# Patient Record
Sex: Male | Born: 1970
Health system: Southern US, Community
[De-identification: ages and names within clinical notes are randomized; demographics above are authoritative.]

## PROBLEM LIST (undated history)

## (undated) DIAGNOSIS — K219 Gastro-esophageal reflux disease without esophagitis: Secondary | ICD-10-CM

## (undated) DIAGNOSIS — R0789 Other chest pain: Secondary | ICD-10-CM

## (undated) DIAGNOSIS — K5190 Ulcerative colitis, unspecified, without complications: Secondary | ICD-10-CM

## (undated) DIAGNOSIS — E7800 Pure hypercholesterolemia, unspecified: Secondary | ICD-10-CM

## (undated) DIAGNOSIS — K603 Anal fistula, unspecified: Secondary | ICD-10-CM

## (undated) DIAGNOSIS — Z8719 Personal history of other diseases of the digestive system: Secondary | ICD-10-CM

## (undated) DIAGNOSIS — R51 Headache: Secondary | ICD-10-CM

## (undated) DIAGNOSIS — K611 Rectal abscess: Secondary | ICD-10-CM

## (undated) DIAGNOSIS — H9193 Unspecified hearing loss, bilateral: Secondary | ICD-10-CM

## (undated) DIAGNOSIS — U071 COVID-19: Secondary | ICD-10-CM

## (undated) DIAGNOSIS — K649 Unspecified hemorrhoids: Secondary | ICD-10-CM

## (undated) DIAGNOSIS — Z5189 Encounter for other specified aftercare: Secondary | ICD-10-CM

## (undated) DIAGNOSIS — IMO0001 Reserved for inherently not codable concepts without codable children: Secondary | ICD-10-CM

## (undated) DIAGNOSIS — R519 Headache, unspecified: Secondary | ICD-10-CM

## (undated) DIAGNOSIS — D649 Anemia, unspecified: Secondary | ICD-10-CM

## (undated) DIAGNOSIS — G43909 Migraine, unspecified, not intractable, without status migrainosus: Secondary | ICD-10-CM

## (undated) DIAGNOSIS — Z8711 Personal history of peptic ulcer disease: Secondary | ICD-10-CM

## (undated) DIAGNOSIS — K4090 Unilateral inguinal hernia, without obstruction or gangrene, not specified as recurrent: Secondary | ICD-10-CM

## (undated) DIAGNOSIS — Z973 Presence of spectacles and contact lenses: Secondary | ICD-10-CM

## (undated) HISTORY — DX: Anal fistula, unspecified: K60.30

## (undated) HISTORY — DX: Unspecified hemorrhoids: K64.9

## (undated) HISTORY — DX: Personal history of other diseases of the digestive system: Z87.19

## (undated) HISTORY — DX: Migraine, unspecified, not intractable, without status migrainosus: G43.909

## (undated) HISTORY — DX: Pure hypercholesterolemia, unspecified: E78.00

## (undated) HISTORY — DX: Gastro-esophageal reflux disease without esophagitis: K21.9

## (undated) HISTORY — DX: Anemia, unspecified: D64.9

## (undated) HISTORY — DX: Encounter for other specified aftercare: Z51.89

## (undated) HISTORY — DX: Headache: R51

## (undated) HISTORY — DX: Personal history of peptic ulcer disease: Z87.11

## (undated) HISTORY — DX: Anal fistula: K60.3

## (undated) HISTORY — DX: Unilateral inguinal hernia, without obstruction or gangrene, not specified as recurrent: K40.90

## (undated) HISTORY — PX: HEMORRHOID SURGERY: SHX153

## (undated) HISTORY — DX: Reserved for inherently not codable concepts without codable children: IMO0001

## (undated) HISTORY — DX: Other chest pain: R07.89

## (undated) HISTORY — DX: Rectal abscess: K61.1

## (undated) HISTORY — DX: Headache, unspecified: R51.9

## (undated) HISTORY — PX: OTHER SURGICAL HISTORY: SHX169

---

## 2004-06-16 HISTORY — PX: SPINE SURGERY: SHX786

## 2004-11-22 ENCOUNTER — Inpatient Hospital Stay (HOSPITAL_COMMUNITY): Admission: EM | Admit: 2004-11-22 | Discharge: 2004-11-27 | Payer: Self-pay | Admitting: Emergency Medicine

## 2006-06-16 HISTORY — PX: SPINE SURGERY: SHX786

## 2007-11-21 ENCOUNTER — Encounter: Admission: RE | Admit: 2007-11-21 | Discharge: 2007-11-21 | Payer: Self-pay | Admitting: Orthopaedic Surgery

## 2008-04-03 ENCOUNTER — Ambulatory Visit (HOSPITAL_COMMUNITY): Admission: RE | Admit: 2008-04-03 | Discharge: 2008-04-03 | Payer: Self-pay | Admitting: General Surgery

## 2008-04-03 ENCOUNTER — Encounter (INDEPENDENT_AMBULATORY_CARE_PROVIDER_SITE_OTHER): Payer: Self-pay | Admitting: General Surgery

## 2009-06-16 HISTORY — PX: MOUTH SURGERY: SHX715

## 2010-10-29 NOTE — Op Note (Signed)
NAME:  ZAVON, HYSON NO.:  [id number]   MEDICAL RECORD NO.:  [id number]          PATIENT TYPE:  AMB   LOCATION:  DAY                          FACILITY:  [facility]   PHYSICIAN:  Sammuel Hines. Daiva Nakayama, M.D. DATE OF BIRTH:  Jul 27, 1970   DATE OF PROCEDURE:  04/03/2008  DATE OF DISCHARGE:                               OPERATIVE REPORT   PREOPERATIVE DIAGNOSIS:  Internal and external hemorrhoids.   POSTOPERATIVE DIAGNOSIS:  Internal and external hemorrhoids.   PROCEDURE:  Hemorrhoidectomy.   SURGEON:  Dr. Marlou Starks.   ANESTHESIA:  General endotracheal.   PROCEDURE IN DETAIL:  After informed consent was obtained, the patient  was brought to the operating room and placed in supine position on the  table.  After induction of general anesthesia, the patient was moved  into a lithotomy position.  His perirectal area was then prepped with  Betadine and draped in usual sterile fashion.  The patient had a large  hemorrhoid mostly in the right anterior position.  The perirectal area  was infiltrated with 4% Marcaine with epinephrine and 1 mL of Wydase and  the tissue was massaged gently for several minutes.  The perirectal area  was then draped with 00.25% Marcaine with epinephrine.  A bullet  retractor was placed in the rectum and the rectum was examined  circumferentially.  The patient had a large hemorrhoid in the right  anterior position with both an internal and external component.  The  rest of the perirectal area looked pretty good with no significant  redundant hemorrhoidal tissue.  At this point the significant hemorrhoid  was grasped with two Allis clamps and elevated the base of the appendix.  The base of the hemorrhoid was scored with a 15-blade knife and then  clamped with a hemostat.  The hemorrhoid itself was then excised on top  of the hemostat sharply with tenotomy scissors and this hemorrhoid was  sent to pathology for further evaluation.  The incision was then  closed  with a running 2-0 chromic stitch.  The last few millimeters of the  incision on the outside skin was left open.  The suture line was  reinforced in a couple areas for hemostasis reasons with another figure-  of-eight 2-0 chronic stitch.  Once this was accomplished, the incision  appeared to be nicely closed and was completely hemostatic.  The opening  at the outside was also touched with electrocautery.  At this point the  bullet retractor was removed from the rectum and the rectum looked very  normal now that the large hemorrhoid was removed.  Again there were no  areas of bulging hemorrhoid tissue.  The incision was then coated with  dibucaine ointment and a small piece of Gelfoam with dibucaine ointment  was placed inside the rectum.  Sterile dressings were then applied.  The  patient tolerated the procedure well.  At the end of the case all needle  and sponge counts correct.  The patient was awakened and taken to  recovery in stable condition.      Sammuel Hines. Marlou Starks  III, M.D.  Electronically Signed    PST/MEDQ  D:  04/03/2008  T:  04/03/2008  Job:  [id number]

## 2010-11-01 NOTE — Discharge Summary (Signed)
NAME:  Marc Mercado, LIPTAK NO.:  [id number]   MEDICAL RECORD NO.:  [id number]          PATIENT TYPE:  INP   LOCATION:  5733                         FACILITY:  [facility]   PHYSICIAN:  Raelyn Ensign. Santogade, M.D.DATE OF BIRTH:  24-Jan-1971   DATE OF ADMISSION:  11/22/2004  DATE OF DISCHARGE:  11/27/2004                                 DISCHARGE SUMMARY   DISCHARGE DIAGNOSES:  1.  Severe ulcerative colitis with rectal bleeding.  2.  Posthemorrhagic anemia.  3.  Abdominal pain.   HISTORY OF PRESENT ILLNESS:  Mr. Speciale is a 40 year old male with a few  months of chronic diarrhea and abdominal pain, escalating to 16 to 20 bowel  movements per day, cramping, and tenesmus unresponsive to antibiotics.  He  had a colonoscopy the day of admission that revealed severe ulcerative  colitis with confluent ulcers in the sigmoid.  He was pale and tachycardic  and admitted for inpatient treatment of new onset new diagnosis of  ulcerative colitis.   MEDICATIONS:  Only Imodium and Lomotil.   ALLERGIES:  LEVAQUIN.   For the remainder of the history, please see Dr. Oletta Lamas dictation on November 22, 2004.   PHYSICAL EXAMINATION:  VITAL SIGNS:  He was febrile to 100.2 with blood  pressure 104/60, and pulse 95.  HEENT:  Eyes were anicteric, conjunctivae pale.  CHEST:  Clear.  Heart sounds regular rate and rhythm.  ABDOMEN:  Soft and distended with hyperactive bowel sounds.  Nonlocalizing  generalized mild tenderness.   HOSPITAL COURSE:  The patient was treated with IV steroids and subsequently  p.o. prednisone as well as Asacol.  Gradually his symptoms abated.  His  admission hemoglobin was 7.7 and he was transfused 2 units of blood.  For  the last 3 days of admission his hemoglobin remained over 9.4 and discharge  hemoglobin was 9.8.  White blood cell count on discharge was 17.3 which was  likely secondary to steroids.  Sedimentation rate was 30.  Electrolytes on  discharge were normal with a  blood sugar of 114.  Liver function tests were  negative or normal.  Blood cultures were negative.  On discharge day, the  patient was still having approximately seven small, but formed bowel  movements per day, occasionally with some blood present.  He had no  abdominal pain and was feeling quite well.   CONDITION ON DISCHARGE:  Improved.   DISCHARGE MEDICATIONS:  1.  Prednisone 50 mg q.a.m. and 20 mg with supper.  2.  Asacol 400 mg six pills b.i.d.   FOLLOW UP:  Return to the office to see Dr. Oletta Lamas within 1 week for  instructions on steroid taper.       PJS/MEDQ  D:  11/27/2004  T:  11/27/2004  Job:  [id number]   cc:   Jeneen Rinks L. Rolla Flatten., M.D.  [address], [place]  [place]  [place] [address]  Fax: [phone]

## 2010-11-01 NOTE — H&P (Signed)
NAME:  Marc Mercado, Marc Mercado NO.:  [id number]   MEDICAL RECORD NO.:  [id number]          PATIENT TYPE:  INP   LOCATION:                               FACILITY:  [facility]   PHYSICIAN:  Marc Mercado., M.D.DATE OF BIRTH:  [birth year]   DATE OF ADMISSION:  11/22/2004  DATE OF DISCHARGE:                                HISTORY & PHYSICAL   REASON FOR ADMISSION:  Severe ulcerative colitis and rectal bleeding.   HISTORY OF PRESENT ILLNESS:  A 40 year old gentleman who has had chronic  diarrhea and abdominal pain for a couple of months.  He normally had one to  two stools a day and approximately the end of March to early April, he woke  up with profuse diarrhea, some vomiting and cramping and thought this was an  acute viral gastroenteritis.  It got worse over the subsequent couple of  days with 16-20 bowel movements a day, cramping abdominal pain and tenesmus.  He had no blood, it was watery stools.  He was given Cipro with the idea  that this was infectious.  He had stool cultures done that were normal for  Clostridium difficile, Salmonella enteric pathogens, O&P.  The patient had  not had any recent travel.  No one else in the family had been sick.  He did  have fish, cats, dogs and a lizard and had been on Flagyl with some  improvement.  At the time that I saw him, he was on Flagyl.  His stools were  firming up and he had gone from 20 bowel movements a day down to four a day.  His exam in mid May revealed a nontender abdomen with brown loose stool that  was trace heme-positive.  My feeling at that time was that this was probably  a resolving infection.  He has continued to have loose stools, called back,  was having worsening stools and we decided to set him up for a colonoscopy.  Colonoscopy performed today revealed severe ulcerative colitis with  pancolitis and confluent ulcers in the sigmoid colon.  The patient appeared  quite pale and was tachycardic and it was felt  that inpatient treatment was  warranted.   MEDICATIONS:  Only Imodium and Lomotil.   ALLERGIES:  HE IS ALLERGIC TO LEVAQUIN.   PAST MEDICAL HISTORY:  No chronic medical problems.  He did have a history  of gastric ulcers and duodenal ulcers, history of migraines and has had  previous hemorrhoid injections by Dr. Lennie Hummer.   FAMILY HISTORY:  Mother died of breast and ovarian cancer.  No family  history of colon cancer, inflammatory bowel disease.  Two children are  healthy.   SOCIAL HISTORY:  He is married, works in [employer], engineering.  Does not smoke or  drink.   REVIEW OF SYSTEMS:  Primarily remarkable for the abdominal cramps and  diarrhea.  Otherwise is negative.   PHYSICAL EXAMINATION:  VITAL SIGNS:  The patient's temperature is 100, pulse  110, blood pressure 110/68.  GENERAL:  A pale, ill-appearing white male following a colonoscopy.  He  is  waking up and is alert and oriented.  He denies any pain currently.  HEENT:  Eyes clear, nonicteric.  Throat and mucous membranes quite dry.  HEART:  A regular rate and rhythm without murmurs or gallops.  LUNGS:  Clear.  ABDOMEN:  Soft, slightly distended with hyperactive bowel sounds with mild  tenderness and nonlocalizing.   ASSESSMENT:  1.  Severe ulcerative colitis with confluent ulcers in the left colon but      pancolitis extending to the cecum.  2.  Probable anemia secondary to chronic rectal bleeding.   PLAN:  Will admit, keep him on clear liquids.  Start IV steroids and  transfusion as needed.           ______________________________  Marc Mercado., M.D.     Marc Mercado  D:  11/22/2004  T:  11/22/2004  Job:  [id number]   cc:   Marc Mercado. Marc Mercado, M.D.  [address] [place]  [place] [address]  Fax: [phone]

## 2011-03-18 LAB — HEMOGLOBIN AND HEMATOCRIT, BLOOD: HCT: 33 — ABNORMAL LOW

## 2012-02-27 ENCOUNTER — Ambulatory Visit (INDEPENDENT_AMBULATORY_CARE_PROVIDER_SITE_OTHER): Payer: BC Managed Care – PPO | Admitting: General Surgery

## 2012-02-27 ENCOUNTER — Encounter (INDEPENDENT_AMBULATORY_CARE_PROVIDER_SITE_OTHER): Payer: Self-pay | Admitting: General Surgery

## 2012-02-27 VITALS — BP 126/84 | HR 100 | Temp 97.9°F | Ht 71.0 in | Wt 202.6 lb

## 2012-02-27 DIAGNOSIS — K5190 Ulcerative colitis, unspecified, without complications: Secondary | ICD-10-CM

## 2012-02-27 MED ORDER — HYDROCORTISONE ACE-PRAMOXINE 2.5-1 % RE CREA: TOPICAL_CREAM | Freq: Four times a day (QID) | RECTAL | Status: AC

## 2012-02-27 NOTE — Patient Instructions (Signed)
Will try analpram to settle down inflammation

## 2012-03-01 ENCOUNTER — Encounter (INDEPENDENT_AMBULATORY_CARE_PROVIDER_SITE_OTHER): Payer: Self-pay | Admitting: General Surgery

## 2012-03-01 NOTE — Progress Notes (Signed)
Subjective:     Patient ID: Marc Mercado, male   DOB: [birth year], 41 y.o.   MRN: [id number]  HPI The patient is a 41 year old white male who we have seen several times in the past with internal hemorrhoid issues. The patient also has ulcerative colitis. He was doing well until about 7 months ago at which time he developed the stomach prior superior he had lots of diarrhea. This caused his ulcerative colitis flareup. Over the last several months he has been on and off steroids. The specimen that he had some issues with some great red blood with his bowel movements and some burning sensation. This is now resolved.  Review of Systems  Constitutional: Negative.   HENT: Negative.   Eyes: Negative.   Respiratory: Negative.   Cardiovascular: Negative.   Gastrointestinal: Positive for blood in stool and rectal pain.  Genitourinary: Negative.   Musculoskeletal: Negative.   Skin: Negative.   Neurological: Negative.   Hematological: Negative.   Psychiatric/Behavioral: Negative.        Objective:   Physical Exam  Constitutional: He is oriented to person, place, and time. He appears well-developed and well-nourished.  HENT:  Head: Normocephalic and atraumatic.  Eyes: Conjunctivae normal and EOM are normal. Pupils are equal, round, and reactive to light.  Neck: Normal range of motion. Neck supple.  Cardiovascular: Normal rate, regular rhythm and normal heart sounds.   Pulmonary/Chest: Effort normal and breath sounds normal.  Abdominal: Soft. Bowel sounds are normal. There is no tenderness.  Genitourinary:       On rectal exam he has very mild sort of internal and external hemorrhoidal tissue. On anoscopic exam it looks as though the mucosa of his rectum is generally a little bit inflamed.  Musculoskeletal: Normal range of motion.  Neurological: He is alert and oriented to person, place, and time.  Skin: Skin is warm and dry.  Psychiatric: He has a normal mood and affect. His behavior is  normal.       Assessment:     The patient has what looks like a flareup of his ulcerative colitis. I do not appreciate any hemorrhoidal tissue that enough to intervene surgically.    Plan:     At this point we will continue his ulcerative colitis treatment. We will also add some topical steroid to his lower rectal area to see if this helps. We will plan to see him back in about a month to check his progress

## 2012-03-19 ENCOUNTER — Encounter (INDEPENDENT_AMBULATORY_CARE_PROVIDER_SITE_OTHER): Payer: BC Managed Care – PPO | Admitting: General Surgery

## 2012-09-02 ENCOUNTER — Other Ambulatory Visit: Payer: Self-pay | Admitting: Gastroenterology

## 2013-03-11 ENCOUNTER — Other Ambulatory Visit: Payer: Self-pay | Admitting: Gastroenterology

## 2013-11-25 ENCOUNTER — Encounter (INDEPENDENT_AMBULATORY_CARE_PROVIDER_SITE_OTHER): Payer: Self-pay | Admitting: General Surgery

## 2013-11-25 ENCOUNTER — Ambulatory Visit (INDEPENDENT_AMBULATORY_CARE_PROVIDER_SITE_OTHER): Payer: BC Managed Care – PPO | Admitting: General Surgery

## 2013-11-25 VITALS — BP 132/78 | HR 90 | Temp 97.8°F | Ht 72.0 in | Wt 181.0 lb

## 2013-11-25 DIAGNOSIS — K4090 Unilateral inguinal hernia, without obstruction or gangrene, not specified as recurrent: Secondary | ICD-10-CM

## 2013-11-25 NOTE — Progress Notes (Signed)
Patient ID: Marc Mercado, male   DOB: Aug 15, 1970, 43 y.o.   MRN: [id number]  Chief Complaint  Patient presents with  . eval rih    HPI Marc Mercado is a 43 y.o. male.  We are asked to see the patient in consultation by Dr. Jonathon Jordan to evaluate him for a right inguinal hernia. The patient is a 43 year old white male who I have seen in the past for hemorrhoid problems. He has a history of ulcerative colitis. During his recent routine physical exam he was found to have a small right inguinal hernia. He has not noticed any bulging in the right groin. He has noticed some occasional burning or pinching sensation in the right groin. He has had no nausea or vomiting.  HPI  Past Medical History  Diagnosis Date  . Anemia   . Blood transfusion     Past Surgical History  Procedure Laterality Date  . Hemorrhoid surgery  2010 and 2012  . Spine surgery  2008    L5    Family History  Problem Relation Age of Onset  . Cancer Mother     breast [place] Farmer  . Cancer Father     skin    Social History History  Substance Use Topics  . Smoking status: Former Smoker    Quit date: 02/27/1999  . Smokeless tobacco: Not on file  . Alcohol Use: No    Allergies  Allergen Reactions  . Levaquin [Levofloxacin] Itching and Rash    Current Outpatient Prescriptions  Medication Sig Dispense Refill  . azaTHIOprine (IMURAN) 50 MG tablet Take 100 mg by mouth daily.       . Mesalamine (DELZICOL PO) Take 400 mg by mouth 6 (six) times daily.       No current facility-administered medications for this visit.    Review of Systems Review of Systems  Constitutional: Negative.   HENT: Negative.   Eyes: Negative.   Respiratory: Negative.   Cardiovascular: Negative.   Gastrointestinal: Negative.   Endocrine: Negative.   Genitourinary: Negative.   Musculoskeletal: Negative.   Skin: Negative.   Allergic/Immunologic: Negative.   Neurological: Negative.   Hematological: Negative.    Psychiatric/Behavioral: Negative.     Blood pressure 132/78, pulse 90, temperature 97.8 F (36.6 C), height 6' (1.829 m), weight 181 lb (82.101 kg).  Physical Exam Physical Exam  Constitutional: He is oriented to person, place, and time. He appears well-developed and well-nourished.  HENT:  Head: Normocephalic and atraumatic.  Eyes: Conjunctivae and EOM are normal. Pupils are equal, round, and reactive to light.  Neck: Normal range of motion. Neck supple.  Cardiovascular: Normal rate, regular rhythm and normal heart sounds.   Pulmonary/Chest: Effort normal and breath sounds normal.  Abdominal: Soft. Bowel sounds are normal.  Genitourinary:  There is no obvious bulge in the right groin. There is a small impulse with straining in the right groin. There is no bulge or impulse with straining in the left groin.  Musculoskeletal: Normal range of motion.  Neurological: He is alert and oriented to person, place, and time.  Skin: Skin is warm and dry.  Psychiatric: He has a normal mood and affect. His behavior is normal.    Data Reviewed As above  Assessment    The patient has a very small and mostly a symptomatic hernia in the right groin. Because of the risk of incarceration and strangulation he may benefit from having this fixed at some point. Given now small it is  and the fact that he seems to be asymptomatic I think it would be reasonable to plan this around his schedule. I've discussed with him in detail the risks and benefits of the operation to fix the hernia as well as some of the technical aspects and he understands and wishes to proceed    Plan    Plan for right inguinal hernia repair with mesh. The patient will call when he is ready to schedule.        TOTH III,PAUL S 11/25/2013, 4:21 PM

## 2013-11-25 NOTE — Patient Instructions (Signed)
Call if you have any discomfort

## 2015-03-12 ENCOUNTER — Ambulatory Visit (INDEPENDENT_AMBULATORY_CARE_PROVIDER_SITE_OTHER): Payer: Managed Care, Other (non HMO) | Admitting: Podiatry

## 2015-03-12 ENCOUNTER — Encounter: Payer: Self-pay | Admitting: Podiatry

## 2015-03-12 ENCOUNTER — Ambulatory Visit (INDEPENDENT_AMBULATORY_CARE_PROVIDER_SITE_OTHER): Payer: Managed Care, Other (non HMO)

## 2015-03-12 VITALS — BP 123/72 | HR 70 | Resp 16

## 2015-03-12 DIAGNOSIS — M7742 Metatarsalgia, left foot: Secondary | ICD-10-CM

## 2015-03-12 DIAGNOSIS — M779 Enthesopathy, unspecified: Secondary | ICD-10-CM | POA: Diagnosis not present

## 2015-03-12 DIAGNOSIS — M79672 Pain in left foot: Secondary | ICD-10-CM | POA: Diagnosis not present

## 2015-03-12 DIAGNOSIS — S93602A Unspecified sprain of left foot, initial encounter: Secondary | ICD-10-CM | POA: Diagnosis not present

## 2015-03-12 DIAGNOSIS — M8430XA Stress fracture, unspecified site, initial encounter for fracture: Secondary | ICD-10-CM

## 2015-03-12 MED ORDER — TRIAMCINOLONE ACETONIDE 10 MG/ML IJ SUSP
10.0000 mg | Freq: Once | INTRAMUSCULAR | Status: AC
  Administered 2015-03-12: 10 mg

## 2015-03-12 NOTE — Progress Notes (Signed)
   Subjective:    Patient ID: Marc Mercado, male    DOB: [birth year], 44 y.o.   MRN: [id number]  HPI Pt presents with left lateral dorsal pain x 8 months. Pain has come off and on and worse when ambulating after rest. He has tried to rest, and wear supportive shoes.    Review of Systems  All other systems reviewed and are negative.      Objective:   Physical Exam        Assessment & Plan:

## 2015-03-12 NOTE — Progress Notes (Signed)
Subjective:     Patient ID: Marc Mercado, male   DOB: 1971-04-21, 44 y.o.   MRN: [id number]  HPI patient presents stating I been having a lot of pain on top of my left foot for the last 8 months and it makes it hard for me to be active. I don't remember any kind of injury   Review of Systems  All other systems reviewed and are negative.      Objective:   Physical Exam  Constitutional: He is oriented to person, place, and time.  Cardiovascular: Intact distal pulses.   Musculoskeletal: Normal range of motion.  Neurological: He is oriented to person, place, and time.  Skin: Skin is warm.  Nursing note and vitals reviewed.  neurovascular status intact muscle strength adequate range of motion within normal limits with patient noted to have exquisite discomfort on the fourth metatarsal shaft left slightly into the third and fifth with edema in the area. No tendon dysfunction was found and normal range of motion of the digits was noted and patient is found to have good digital perfusion and is well oriented 3     Assessment:     Probable inflammatory tendinitis left with possible stress fracture or other bone condition    Plan:     H&P and x-rays reviewed with patient. Today I did a careful sheath injection left 3 mg Kenalog 5 mg Xylocaine advised on ice therapy and applied air fracture walker for complete immobilization. Reappoint to recheck again in 3 weeks and was advised to take the boot off slowly starting in approximately 2 weeks

## 2015-04-02 ENCOUNTER — Encounter: Payer: Self-pay | Admitting: Podiatry

## 2015-04-02 ENCOUNTER — Ambulatory Visit (INDEPENDENT_AMBULATORY_CARE_PROVIDER_SITE_OTHER): Payer: Managed Care, Other (non HMO) | Admitting: Podiatry

## 2015-04-02 ENCOUNTER — Ambulatory Visit (INDEPENDENT_AMBULATORY_CARE_PROVIDER_SITE_OTHER): Payer: Managed Care, Other (non HMO)

## 2015-04-02 VITALS — BP 118/64 | HR 66 | Resp 16

## 2015-04-02 DIAGNOSIS — M779 Enthesopathy, unspecified: Secondary | ICD-10-CM | POA: Diagnosis not present

## 2015-04-02 DIAGNOSIS — M8430XA Stress fracture, unspecified site, initial encounter for fracture: Secondary | ICD-10-CM

## 2015-04-02 DIAGNOSIS — M79672 Pain in left foot: Secondary | ICD-10-CM

## 2015-04-02 MED ORDER — PREDNISONE 10 MG PO TABS: ORAL_TABLET | ORAL | Status: DC

## 2015-04-02 NOTE — Progress Notes (Signed)
Subjective:     Patient ID: Marc Mercado, male   DOB: June 03, 1971, 44 y.o.   MRN: [id number]  HPI patient states my foot seems better but it when I went without the boot I still noticed it hurting   Review of Systems     Objective:   Physical Exam Neurovascular status is intact muscle strength was adequate with continuation of low-grade to moderate grade edema around the third and fourth metatarsal mid shaft area with no proximal or distal pathology noted    Assessment:     Very difficult to ascertain whether or not this could be a stress fracture of a minimal nature or sub-clinical nature versus some form of tendinitis or other inflammatory condition    Plan:     I'm going to start him on a 12 day Sterapred DS Dosepak in addition to aggressive ice and continued boot usage. Reappoint one month and may require MRI or other treatment

## 2015-04-30 ENCOUNTER — Ambulatory Visit (INDEPENDENT_AMBULATORY_CARE_PROVIDER_SITE_OTHER): Payer: Managed Care, Other (non HMO)

## 2015-04-30 ENCOUNTER — Encounter: Payer: Self-pay | Admitting: Podiatry

## 2015-04-30 ENCOUNTER — Ambulatory Visit (INDEPENDENT_AMBULATORY_CARE_PROVIDER_SITE_OTHER): Payer: Managed Care, Other (non HMO) | Admitting: Podiatry

## 2015-04-30 VITALS — BP 119/67 | HR 75 | Resp 16

## 2015-04-30 DIAGNOSIS — M8430XD Stress fracture, unspecified site, subsequent encounter for fracture with routine healing: Secondary | ICD-10-CM

## 2015-04-30 DIAGNOSIS — M79672 Pain in left foot: Secondary | ICD-10-CM

## 2015-04-30 NOTE — Progress Notes (Signed)
Subjective:     Patient ID: Marc Mercado, male   DOB: 1970-06-29, 44 y.o.   MRN: [id number]  HPI my foot seems quite a bit better but I still get pain if I do certain types of activities   Review of Systems     Objective:   Physical Exam Neurovascular status intact muscle strength adequate with mild discomfort dorsal left foot around the third fourth metatarsal midshaft areas and slightly proximal with no indications currently of significant edema    Assessment:     Possibility for low-grade stress fracture versus some kind of ligament or tendon injury    Plan:     H&P conditions reviewed and at this time gradual reduction of the boot and will be seen back as needed. May require further treatment

## 2016-06-16 HISTORY — PX: WRIST SURGERY: SHX841

## 2017-06-16 HISTORY — PX: OTHER SURGICAL HISTORY: SHX169

## 2017-10-13 ENCOUNTER — Other Ambulatory Visit: Payer: Self-pay | Admitting: Orthopedic Surgery

## 2017-10-13 ENCOUNTER — Ambulatory Visit
Admission: RE | Admit: 2017-10-13 | Discharge: 2017-10-13 | Disposition: A | Discharge status: Home / Self Care | Payer: Self-pay | Source: Ambulatory Visit | Attending: Orthopedic Surgery | Admitting: Orthopedic Surgery

## 2017-10-13 DIAGNOSIS — S92312A Displaced fracture of first metatarsal bone, left foot, initial encounter for closed fracture: Secondary | ICD-10-CM

## 2018-01-15 DIAGNOSIS — M79672 Pain in left foot: Secondary | ICD-10-CM | POA: Diagnosis not present

## 2018-01-22 DIAGNOSIS — M79672 Pain in left foot: Secondary | ICD-10-CM | POA: Diagnosis not present

## 2018-03-15 DIAGNOSIS — K6289 Other specified diseases of anus and rectum: Secondary | ICD-10-CM | POA: Diagnosis not present

## 2018-03-15 DIAGNOSIS — K51519 Left sided colitis with unspecified complications: Secondary | ICD-10-CM | POA: Diagnosis not present

## 2018-03-15 DIAGNOSIS — K602 Anal fissure, unspecified: Secondary | ICD-10-CM | POA: Diagnosis not present

## 2018-03-17 DIAGNOSIS — S92312A Displaced fracture of first metatarsal bone, left foot, initial encounter for closed fracture: Secondary | ICD-10-CM | POA: Diagnosis not present

## 2018-03-23 ENCOUNTER — Other Ambulatory Visit: Payer: Self-pay | Admitting: Gastroenterology

## 2018-03-23 DIAGNOSIS — K6289 Other specified diseases of anus and rectum: Secondary | ICD-10-CM

## 2018-03-24 ENCOUNTER — Ambulatory Visit
Admission: RE | Admit: 2018-03-24 | Discharge: 2018-03-24 | Disposition: A | Discharge status: Home / Self Care | Payer: Self-pay | Source: Ambulatory Visit | Attending: Gastroenterology | Admitting: Gastroenterology

## 2018-03-24 DIAGNOSIS — K6289 Other specified diseases of anus and rectum: Secondary | ICD-10-CM | POA: Diagnosis not present

## 2018-03-24 DIAGNOSIS — N281 Cyst of kidney, acquired: Secondary | ICD-10-CM | POA: Diagnosis not present

## 2018-03-24 MED ORDER — IOPAMIDOL (ISOVUE-300) INJECTION 61%
100.0000 mL | Freq: Once | INTRAVENOUS | Status: AC | PRN
  Administered 2018-03-24: 100 mL via INTRAVENOUS

## 2018-03-26 ENCOUNTER — Other Ambulatory Visit: Payer: Self-pay

## 2018-03-29 DIAGNOSIS — K611 Rectal abscess: Secondary | ICD-10-CM | POA: Diagnosis not present

## 2018-04-02 ENCOUNTER — Ambulatory Visit: Payer: Self-pay | Admitting: General Surgery

## 2018-04-12 ENCOUNTER — Other Ambulatory Visit: Payer: Self-pay

## 2018-04-12 ENCOUNTER — Encounter (HOSPITAL_BASED_OUTPATIENT_CLINIC_OR_DEPARTMENT_OTHER): Payer: Self-pay | Admitting: *Deleted

## 2018-04-12 NOTE — Progress Notes (Signed)
SPOKE WITH Marc Mercado  NPO AFTER MIDNIGHT , ARRIVE 900 AM 04-16-18 [place] NO MEDS TO TAKE DRIVER WIFE ANGIE NO LABS NEEDED SURGERY ORDERS IN EPIC

## 2018-04-16 ENCOUNTER — Ambulatory Visit (HOSPITAL_BASED_OUTPATIENT_CLINIC_OR_DEPARTMENT_OTHER): Payer: BLUE CROSS/BLUE SHIELD | Admitting: Anesthesiology

## 2018-04-16 ENCOUNTER — Ambulatory Visit (HOSPITAL_BASED_OUTPATIENT_CLINIC_OR_DEPARTMENT_OTHER)
Admission: RE | Admit: 2018-04-16 | Discharge: 2018-04-16 | Disposition: A | Discharge status: Home / Self Care | Payer: BLUE CROSS/BLUE SHIELD | Source: Ambulatory Visit | Attending: General Surgery | Admitting: General Surgery

## 2018-04-16 ENCOUNTER — Other Ambulatory Visit: Payer: Self-pay

## 2018-04-16 ENCOUNTER — Encounter (HOSPITAL_BASED_OUTPATIENT_CLINIC_OR_DEPARTMENT_OTHER)
Admission: RE | Disposition: A | Discharge status: Home / Self Care | Payer: Self-pay | Source: Ambulatory Visit | Attending: General Surgery

## 2018-04-16 ENCOUNTER — Encounter (HOSPITAL_BASED_OUTPATIENT_CLINIC_OR_DEPARTMENT_OTHER): Payer: Self-pay | Admitting: Emergency Medicine

## 2018-04-16 DIAGNOSIS — K611 Rectal abscess: Secondary | ICD-10-CM | POA: Diagnosis not present

## 2018-04-16 DIAGNOSIS — Z87891 Personal history of nicotine dependence: Secondary | ICD-10-CM | POA: Insufficient documentation

## 2018-04-16 DIAGNOSIS — K612 Anorectal abscess: Secondary | ICD-10-CM | POA: Insufficient documentation

## 2018-04-16 DIAGNOSIS — K219 Gastro-esophageal reflux disease without esophagitis: Secondary | ICD-10-CM | POA: Insufficient documentation

## 2018-04-16 DIAGNOSIS — Z79899 Other long term (current) drug therapy: Secondary | ICD-10-CM | POA: Diagnosis not present

## 2018-04-16 HISTORY — DX: Ulcerative colitis, unspecified, without complications: K51.90

## 2018-04-16 HISTORY — PX: INCISION AND DRAINAGE ABSCESS: SHX5864

## 2018-04-16 SURGERY — INCISION AND DRAINAGE, ABSCESS
Anesthesia: Monitor Anesthesia Care

## 2018-04-16 MED ORDER — SODIUM CHLORIDE 0.9 % IV SOLN
250.0000 mL | INTRAVENOUS | Status: DC | PRN
  Filled 2018-04-16: qty 250

## 2018-04-16 MED ORDER — PROPOFOL 500 MG/50ML IV EMUL
INTRAVENOUS | Status: AC
  Filled 2018-04-16: qty 50

## 2018-04-16 MED ORDER — HYDROCODONE-ACETAMINOPHEN 5-325 MG PO TABS: 1.0000 | ORAL_TABLET | Freq: Four times a day (QID) | ORAL | 0 refills | Status: DC | PRN

## 2018-04-16 MED ORDER — PHENYLEPHRINE 40 MCG/ML (10ML) SYRINGE FOR IV PUSH (FOR BLOOD PRESSURE SUPPORT)
PREFILLED_SYRINGE | INTRAVENOUS | Status: AC
  Filled 2018-04-16: qty 20

## 2018-04-16 MED ORDER — ACETAMINOPHEN 325 MG PO TABS
650.0000 mg | ORAL_TABLET | ORAL | Status: DC | PRN
  Filled 2018-04-16: qty 2

## 2018-04-16 MED ORDER — SODIUM CHLORIDE 0.9% FLUSH
3.0000 mL | INTRAVENOUS | Status: DC | PRN
  Filled 2018-04-16: qty 3

## 2018-04-16 MED ORDER — SODIUM CHLORIDE 0.9% FLUSH
3.0000 mL | Freq: Two times a day (BID) | INTRAVENOUS | Status: DC
  Filled 2018-04-16: qty 3

## 2018-04-16 MED ORDER — PROPOFOL 10 MG/ML IV BOLUS
INTRAVENOUS | Status: DC | PRN
  Administered 2018-04-16: 40 mg via INTRAVENOUS

## 2018-04-16 MED ORDER — LACTATED RINGERS IV SOLN
INTRAVENOUS | Status: DC
  Administered 2018-04-16: 10:00:00 via INTRAVENOUS
  Filled 2018-04-16: qty 1000

## 2018-04-16 MED ORDER — MIDAZOLAM HCL 2 MG/2ML IJ SOLN
INTRAMUSCULAR | Status: DC | PRN
  Administered 2018-04-16: 1 mg via INTRAVENOUS

## 2018-04-16 MED ORDER — OXYCODONE HCL 5 MG PO TABS
5.0000 mg | ORAL_TABLET | ORAL | Status: DC | PRN
  Filled 2018-04-16: qty 2

## 2018-04-16 MED ORDER — MIDAZOLAM HCL 2 MG/2ML IJ SOLN
INTRAMUSCULAR | Status: AC
  Filled 2018-04-16: qty 2

## 2018-04-16 MED ORDER — CELECOXIB 200 MG PO CAPS
200.0000 mg | ORAL_CAPSULE | ORAL | Status: DC
  Filled 2018-04-16: qty 1

## 2018-04-16 MED ORDER — LIDOCAINE 5 % EX OINT
TOPICAL_OINTMENT | CUTANEOUS | Status: DC | PRN
  Administered 2018-04-16: 1 via TOPICAL

## 2018-04-16 MED ORDER — ACETAMINOPHEN 500 MG PO TABS
1000.0000 mg | ORAL_TABLET | ORAL | Status: AC
  Administered 2018-04-16: 1000 mg via ORAL
  Filled 2018-04-16: qty 2

## 2018-04-16 MED ORDER — OXYCODONE HCL 5 MG PO TABS
5.0000 mg | ORAL_TABLET | Freq: Once | ORAL | Status: DC | PRN
  Filled 2018-04-16: qty 1

## 2018-04-16 MED ORDER — PROPOFOL 500 MG/50ML IV EMUL
INTRAVENOUS | Status: DC | PRN
  Administered 2018-04-16: 250 ug/kg/min via INTRAVENOUS

## 2018-04-16 MED ORDER — ACETIC ACID 5 % SOLN
Status: AC
  Filled 2018-04-16: qty 500

## 2018-04-16 MED ORDER — ACETAMINOPHEN 650 MG RE SUPP
650.0000 mg | RECTAL | Status: DC | PRN
  Filled 2018-04-16: qty 1

## 2018-04-16 MED ORDER — BUPIVACAINE-EPINEPHRINE 0.5% -1:200000 IJ SOLN
INTRAMUSCULAR | Status: DC | PRN
  Administered 2018-04-16: 40 mL

## 2018-04-16 MED ORDER — FENTANYL CITRATE (PF) 100 MCG/2ML IJ SOLN
25.0000 ug | INTRAMUSCULAR | Status: DC | PRN
  Filled 2018-04-16: qty 1

## 2018-04-16 MED ORDER — ONDANSETRON HCL 4 MG/2ML IJ SOLN
4.0000 mg | Freq: Once | INTRAMUSCULAR | Status: DC | PRN
  Filled 2018-04-16: qty 2

## 2018-04-16 MED ORDER — OXYCODONE HCL 5 MG/5ML PO SOLN
5.0000 mg | Freq: Once | ORAL | Status: DC | PRN
  Filled 2018-04-16: qty 5

## 2018-04-16 MED ORDER — ACETAMINOPHEN 500 MG PO TABS
ORAL_TABLET | ORAL | Status: AC
  Filled 2018-04-16: qty 2

## 2018-04-16 MED ORDER — CELECOXIB 200 MG PO CAPS
ORAL_CAPSULE | ORAL | Status: AC
  Filled 2018-04-16: qty 1

## 2018-04-16 SURGICAL SUPPLY — 51 items
APL SKNCLS STERI-STRIP NONHPOA (GAUZE/BANDAGES/DRESSINGS) ×1
BENZOIN TINCTURE PRP APPL 2/3 (GAUZE/BANDAGES/DRESSINGS) ×3 IMPLANT
BLADE EXTENDED COATED 6.5IN (ELECTRODE) IMPLANT
BLADE HEX COATED 2.75 (ELECTRODE) ×3 IMPLANT
BLADE SURG 10 STRL SS (BLADE) IMPLANT
BRIEF STRETCH FOR OB PAD LRG (UNDERPADS AND DIAPERS) ×3 IMPLANT
CANISTER SUCT 3000ML PPV (MISCELLANEOUS) ×3 IMPLANT
COVER BACK TABLE 60X90IN (DRAPES) ×3 IMPLANT
COVER MAYO STAND STRL (DRAPES) ×3 IMPLANT
COVER WAND RF STERILE (DRAPES) ×6 IMPLANT
DRAPE LAPAROTOMY 100X72 PEDS (DRAPES) ×3 IMPLANT
DRAPE SHEET LG 3/4 BI-LAMINATE (DRAPES) IMPLANT
DRAPE UNDERBUTTOCKS STRL (DRAPE) IMPLANT
DRAPE UTILITY XL STRL (DRAPES) ×3 IMPLANT
ELECT REM PT RETURN 9FT ADLT (ELECTROSURGICAL) ×3
ELECTRODE REM PT RTRN 9FT ADLT (ELECTROSURGICAL) ×1 IMPLANT
GAUZE SPONGE 4X4 12PLY STRL (GAUZE/BANDAGES/DRESSINGS) IMPLANT
GLOVE BIO SURGEON STRL SZ 6.5 (GLOVE) ×2 IMPLANT
GLOVE BIO SURGEONS STRL SZ 6.5 (GLOVE) ×1
GLOVE BIOGEL PI IND STRL 7.0 (GLOVE) ×1 IMPLANT
GLOVE BIOGEL PI INDICATOR 7.0 (GLOVE) ×2
GOWN SPEC L3 XXLG W/TWL (GOWN DISPOSABLE) ×3 IMPLANT
HYDROGEN PEROXIDE 16OZ (MISCELLANEOUS) IMPLANT
IV CATH 14GX2 1/4 (CATHETERS) IMPLANT
IV CATH 18G SAFETY (IV SOLUTION) IMPLANT
KIT TURNOVER CYSTO (KITS) ×3 IMPLANT
LEGGING LITHOTOMY PAIR STRL (DRAPES) IMPLANT
LOOP VESSEL MAXI BLUE (MISCELLANEOUS) IMPLANT
NEEDLE HYPO 22GX1.5 SAFETY (NEEDLE) ×3 IMPLANT
NS IRRIG 500ML POUR BTL (IV SOLUTION) ×3 IMPLANT
PACK BASIN DAY SURGERY FS (CUSTOM PROCEDURE TRAY) ×3 IMPLANT
PAD ABD 8X10 STRL (GAUZE/BANDAGES/DRESSINGS) ×3 IMPLANT
PAD ARMBOARD 7.5X6 YLW CONV (MISCELLANEOUS) ×3 IMPLANT
PAD PREP 24X48 CUFFED NSTRL (MISCELLANEOUS) IMPLANT
PENCIL BUTTON HOLSTER BLD 10FT (ELECTRODE) ×3 IMPLANT
SPONGE HEMORRHOID 8X3CM (HEMOSTASIS) IMPLANT
SPONGE SURGIFOAM ABS GEL 100 (HEMOSTASIS) IMPLANT
SPONGE SURGIFOAM ABS GEL 12-7 (HEMOSTASIS) IMPLANT
SUT CHROMIC 2 0 SH (SUTURE) IMPLANT
SUT CHROMIC 3 0 SH 27 (SUTURE) IMPLANT
SUT ETHIBOND 0 (SUTURE) IMPLANT
SUT VIC AB 2-0 SH 27 (SUTURE)
SUT VIC AB 2-0 SH 27XBRD (SUTURE) IMPLANT
SYR 10ML LL (SYRINGE) IMPLANT
SYR CONTROL 10ML LL (SYRINGE) ×3 IMPLANT
TOWEL OR 17X24 6PK STRL BLUE (TOWEL DISPOSABLE) ×3 IMPLANT
TRAY DSU PREP LF (CUSTOM PROCEDURE TRAY) ×3 IMPLANT
TUBE CONNECTING 12'X1/4 (SUCTIONS) ×1
TUBE CONNECTING 12X1/4 (SUCTIONS) ×2 IMPLANT
UNDERPAD 30X30 (UNDERPADS AND DIAPERS) ×3 IMPLANT
YANKAUER SUCT BULB TIP NO VENT (SUCTIONS) ×3 IMPLANT

## 2018-04-16 NOTE — Anesthesia Postprocedure Evaluation (Signed)
Anesthesia Post Note  Patient: Marc Mercado  Procedure(s) Performed: INCISION AND DRAINAGE OF PERIRECTAL ABSCESS (N/A )     Patient location during evaluation: PACU Anesthesia Type: MAC Level of consciousness: awake and alert Pain management: pain level controlled Vital Signs Assessment: post-procedure vital signs reviewed and stable Respiratory status: spontaneous breathing, nonlabored ventilation and respiratory function stable Cardiovascular status: blood pressure returned to baseline and stable Postop Assessment: no apparent nausea or vomiting Anesthetic complications: no    Last Vitals:  Vitals:   04/16/18 1215 04/16/18 1230  BP: 134/84 (!) 133/93  Pulse: 73 65  Resp: 16 16  Temp:  36.5 C  SpO2: 94% 100%    Last Pain:  Vitals:   04/16/18 1230  TempSrc:   PainSc: 0-No pain                 Lidia Collum

## 2018-04-16 NOTE — Discharge Instructions (Signed)
ANORECTAL SURGERY: POST OP INSTRUCTIONS 1. Take your usually prescribed home medications unless otherwise directed. 2. DIET: During the first few hours after surgery sip on some liquids until you are able to urinate.  It is normal to not urinate for several hours after this surgery.  If you feel uncomfortable, please contact the office for instructions.  After you are able to urinate,you may eat, if you feel like it.  Follow a light bland diet the first 24 hours after arrival home, such as soup, liquids, crackers, etc.  Be sure to include lots of fluids daily (6-8 glasses).  Avoid fast food or heavy meals, as your are more likely to get nauseated.  Eat a low fat diet the next few days after surgery.  Limit caffeine intake to 1-2 servings a day. 3. PAIN CONTROL: a. Pain is best controlled by a usual combination of several different methods TOGETHER: i. Muscle relaxation: Soak in a warm bath (or Sitz bath) three times a day and after bowel movements.  Continue to do this until all pain is resolved. ii. Over the counter pain medication iii. Prescription pain medication b. Most patients will experience some swelling and discomfort in the anus/rectal area and incisions.  Heat such as warm towels, sitz baths, warm baths, etc to help relax tight/sore spots and speed recovery.  Some people prefer to use ice, especially in the first couple days after surgery, as it may decrease the pain and swelling, or alternate between ice & heat.  Experiment to what works for you.  Swelling and bruising can take several weeks to resolve.  Pain can take even longer to completely resolve. c. It is helpful to take an over-the-counter pain medication regularly for the first few weeks.  Choose one of the following that works best for you: i. Naproxen (Aleve, etc)  Two 231m tabs twice a day ii. Ibuprofen (Advil, etc) Three 2043mtabs four times a day (every meal & bedtime) d. A  prescription for pain medication (such as percocet,  oxycodone, hydrocodone, etc) should be given to you upon discharge.  Take your pain medication as prescribed.  i. If you are having problems/concerns with the prescription medicine (does not control pain, nausea, vomiting, rash, itching, etc), please call [phone] see if we need to switch you to a different pain medicine that will work better for you and/or control your side effect better. ii. If you need a refill on your pain medication, please contact your pharmacy.  They will contact our office to request authorization. Prescriptions will not be filled after 5 pm or on week-ends. 4. KEEP YOUR BOWELS REGULAR and AVOID CONSTIPATION a. The goal is one to two soft bowel movements a day.  You should at least have a bowel movement every other day. b. Avoid getting constipated.  Between the surgery and the pain medications, it is common to experience some constipation. This can be very painful after rectal surgery.  Increasing fluid intake and taking a fiber supplement (such as Metamucil, Citrucel, FiberCon, etc) 1-2 times a day regularly will usually help prevent this problem from occurring.  A stool softener like colace is also recommended.  This can be purchased over the counter at your pharmacy.  You can take it up to 3 times a day.  If you do not have a bowel movement after 24 hrs since your surgery, take one does of milk of magnesia.  If you still haven't had a bowel movement 8-12 hours after  that dose, take another dose.  If you don't have a bowel movement 48 hrs after surgery, purchase a Fleets enema from the drug store and administer gently per package instructions.  If you still are having trouble with your bowel movements after that, please call the office for further instructions. c. If you develop diarrhea or have many loose bowel movements, simplify your diet to bland foods & liquids for a few days.  Stop any stool softeners and decrease your fiber supplement.  Switching to mild  anti-diarrheal medications (Kayopectate, Pepto Bismol) can help.  If this worsens or does not improve, please call us.  5. Wound Care a. Remove your bandages before your first bowel movement or 8 hours after surgery.     b. Remove any wound packing material at this tim,e as well.  You do not need to repack the wound unless instructed otherwise.  Wear an absorbent pad or soft cotton gauze in your underwear to catch any drainage and help keep the area clean. You should change this every 2-3 hours while awake. c. Keep the area clean and dry.  Bathe / shower every day, especially after bowel movements.  Keep the area clean by showering / bathing over the incision / wound.   It is okay to soak an open wound to help wash it.  Wet wipes or showers / gentle washing after bowel movements is often less traumatic than regular toilet paper. d. Dennis Bast may have some styrofoam-like soft packing in the rectum which will come out with the first bowel movement.  e. You will often notice bleeding with bowel movements.  This should slow down by the end of the first week of surgery f. Expect some drainage.  This should slow down, too, by the end of the first week of surgery.  Wear an absorbent pad or soft cotton gauze in your underwear until the drainage stops. g. Do Not sit on a rubber or pillow ring.  This can make you symptoms worse.  You may sit on a soft pillow if needed.  6. ACTIVITIES as tolerated:   a. You may resume regular (light) daily activities beginning the next day--such as daily self-care, walking, climbing stairs--gradually increasing activities as tolerated.  If you can walk 30 minutes without difficulty, it is safe to try more intense activity such as jogging, treadmill, bicycling, low-impact aerobics, swimming, etc. b. Save the most intensive and strenuous activity for last such as sit-ups, heavy lifting, contact sports, etc  Refrain from any heavy lifting or straining until you are off narcotics for pain  control.   c. You may drive when you are no longer taking prescription pain medication, you can comfortably sit for long periods of time, and you can safely maneuver your car and apply brakes. d. Dennis Bast may have sexual intercourse when it is comfortable.  7. FOLLOW UP in our office a. Please call [facility] at [phone] to set up an appointment to see your surgeon in the office for a follow-up appointment approximately 3-4 weeks after your surgery. b. Make sure that you call for this appointment the day you arrive home to insure a convenient appointment time. 10. IF YOU HAVE DISABILITY OR FAMILY LEAVE FORMS, BRING THEM TO THE OFFICE FOR PROCESSING.  DO NOT GIVE THEM TO YOUR DOCTOR.     WHEN TO CALL us [phone]: 1. Poor pain control 2. Reactions / problems with new medications (rash/itching, nausea, etc)  3. Fever over 101.5 F (38.5 C) 4.  Inability to urinate 5. Nausea and/or vomiting 6. Worsening swelling or bruising 7. Continued bleeding from incision. 8. Increased pain, redness, or drainage from the incision  The clinic staff is available to answer your questions during regular business hours (8:30am-5pm).  Please dont hesitate to call and ask to speak to one of our nurses for clinical concerns.   A surgeon from [facility] is always on call at the hospitals   If you have a medical emergency, go to the nearest emergency room or call 911.    [facility], [place], [place], [place], [place]  [address] ? MAIN: [phone] ? TOLL FREE: [phone] ? FAX [phone] [email or web address]

## 2018-04-16 NOTE — Op Note (Signed)
04/16/2018  11:37 AM  PATIENT:  Marc Mercado  47 y.o. male  Patient Care Team: Jonathon Jordan, MD as PCP - General (Family Medicine)  PRE-OPERATIVE DIAGNOSIS:  Perirectal abscess  POST-OPERATIVE DIAGNOSIS:  Perirectal abscess  PROCEDURE:  INCISION AND DRAINAGE OF PERIRECTAL ABSCESS  Surgeon(s): Leighton Ruff, MD  ASSISTANT: none   ANESTHESIA:   local and MAC  SPECIMEN:  No Specimen  DISPOSITION OF SPECIMEN:  N/A  COUNTS:  YES  PLAN OF CARE: Discharge to home after PACU  PATIENT DISPOSITION:  PACU - hemodynamically stable.  INDICATION: 47 year old male with a perianal mass tender to palpation.  This is concerning for perirectal abscess.  Given the deep location, it was recommended incision and debridement in the operating room.   OR FINDINGS: Left posterior ischio rectal space abscess with no connecting fistula tract.  DESCRIPTION: the patient was identified in the preoperative holding area and taken to the OR where they were laid on the operating room table.  MAC anesthesia was induced without difficulty. The patient was then positioned in prone jackknife position with buttocks gently taped apart.  The patient was then prepped and draped in usual sterile fashion.  SCDs were noted to be in place prior to the initiation of anesthesia. A surgical timeout was performed indicating the correct patient, procedure, positioning and need for preoperative antibiotics.  A rectal block was performed using Marcaine with epinephrine.    I began with a digital rectal exam.  There were no internal masses noted.   I then placed a Hill-Ferguson anoscope into the anal canal and evaluated this completely. There were no internal openings noted.  I palpated the area of central fluctuance and made an incision using Bovie electrocautery.  I enlarged it to approximately 1 cm in diameter.  Purulent material and blood clot were removed from the abscess cavity.  Identified a fistula tract heading  towards posterior midline but it did not cross the internal sphincter.  A sterile dressing was applied.  Additional Marcaine was placed around the wound and the patient was then awakened from anesthesia and sent to the postanesthesia care unit stable condition.  All counts were correct per operating room staff.  I have reviewed the [place] for this patient.

## 2018-04-16 NOTE — Transfer of Care (Signed)
Immediate Anesthesia Transfer of Care Note  Patient: Marc Mercado  Procedure(s) Performed: INCISION AND DRAINAGE OF PERIRECTAL ABSCESS (N/A )  Patient Location: PACU  Anesthesia Type:MAC  Level of Consciousness: awake, alert , oriented and patient cooperative  Airway & Oxygen Therapy: Patient Spontanous Breathing and Patient connected to face mask oxygen  Post-op Assessment: Report given to RN and Post -op Vital signs reviewed and stable  Post vital signs: Reviewed and stable  Last Vitals:  Vitals Value Taken Time  BP 118/79 04/16/2018 11:41 AM  Temp    Pulse 80 04/16/2018 11:45 AM  Resp 21 04/16/2018 11:45 AM  SpO2 100 % 04/16/2018 11:45 AM  Vitals shown include unvalidated device data.  Last Pain:  Vitals:   04/16/18 0911  TempSrc: Oral  PainSc: 4       Patients Stated Pain Goal: 5 (75/44/92 0100)  Complications: No apparent anesthesia complications

## 2018-04-16 NOTE — H&P (Signed)
History of Present Illness Leighton Ruff MD; 88/04/314 4:20 PM) The patient is a 47 year old male who presents with anal pain. 47 year old male who presents to the office with approximate 3 weeks of pain around his tailbone. He states that he developed a mass in the area with some induration. He developed fevers and was seen by gastroenterology at [place]. Due to the level of pain he was having it was thought to be a fissure and he was placed on diltiazem ointment. His symptoms did improve with that but he was unable to completely get rid of his pain. He was seen back at the GI doctor again and a CT scan was ordered. This showed some inflammation within the ischiorectal space in the right side. Patient does have a history of ulcerative colitis. He is having regular bowel movements and denies any diarrhea currently.   Past Surgical History (Tanisha A. Owens Shark, RMA; 03/29/2018 4:12 PM) Foot Surgery  Left. Hemorrhoidectomy   Allergies (Tanisha A. Owens Shark, [place]; 03/29/2018 4:14 PM) Levaquin *FLUOROQUINOLONES*  NSAIDs  Allergies Reconciled   Medication History (Tanisha A. Owens Shark, [place]; 03/29/2018 4:15 PM) azaTHIOprine (50MG Tablet, Oral) Active. Aprezo Active. Medications Reconciled  Social History (Tanisha A. Owens Shark, [place]; 03/29/2018 4:12 PM) Alcohol use  Occasional alcohol use. Caffeine use  Tea. Tobacco use  Former smoker.  Other Problems (Tanisha A. Owens Shark, RMA; 03/29/2018 4:12 PM) Back Pain  Gastroesophageal Reflux Disease     Review of Systems (Tanisha A. Brown RMA; 03/29/2018 4:12 PM) HEENT Not Present- Earache, Hearing Loss, Hoarseness, Nose Bleed, Oral Ulcers, Ringing in the Ears, Seasonal Allergies, Sinus Pain, Sore Throat, Visual Disturbances, Wears glasses/contact lenses and Yellow Eyes. Respiratory Not Present- Bloody sputum, Chronic Cough, Difficulty Breathing, Snoring and Wheezing. Breast Not Present- Breast Mass, Breast Pain, Nipple Discharge and Skin  Changes. Gastrointestinal Present- Rectal Pain. Not Present- Abdominal Pain, Bloating, Bloody Stool, Change in Bowel Habits, Chronic diarrhea, Constipation, Difficulty Swallowing, Excessive gas, Gets full quickly at meals, Hemorrhoids, Indigestion, Nausea and Vomiting. Male Genitourinary Not Present- Blood in Urine, Change in Urinary Stream, Frequency, Impotence, Nocturia, Painful Urination, Urgency and Urine Leakage.  Vitals (Tanisha A. Brown RMA; 03/29/2018 4:13 PM) 03/29/2018 4:12 PM Weight: 192.2 lb Height: 72in Body Surface Area: 2.09 m Body Mass Index: 26.07 kg/m  Temp.: 98.29F  Pulse: 86 (Regular)  BP: 124/82 (Sitting, Left Arm, Standard)       Physical Exam Leighton Ruff MD; 94/58/5929 4:29 PM) General Mental Status-Alert. General Appearance-Not in acute distress. Build & Nutrition-Well nourished. Posture-Normal posture. Gait-Normal.  Head and Neck Head-normocephalic, atraumatic with no lesions or palpable masses. Trachea-midline.  Chest and Lung Exam Chest and lung exam reveals -on auscultation, normal breath sounds, no adventitious sounds and normal vocal resonance.  Cardiovascular Cardiovascular examination reveals -normal heart sounds, regular rate and rhythm with no murmurs and no digital clubbing, cyanosis, edema, increased warmth or tenderness.  Abdomen Inspection Inspection of the abdomen reveals - No Hernias. Palpation/Percussion Palpation and Percussion of the abdomen reveal - Soft, Non Tender, No Rigidity (guarding), No hepatosplenomegaly and No Palpable abdominal masses.  Rectal Note: Palpable mass in right posterior ischio-anal space with tenderness to palpation consistent with small perirectal abscess.   Neurologic Neurologic evaluation reveals -alert and oriented x 3 with no impairment of recent or remote memory, normal attention span and ability to concentrate, normal sensation and normal  coordination.  Musculoskeletal Normal Exam - Bilateral-Upper Extremity Strength Normal and Lower Extremity Strength Normal.    Assessment & Plan Leighton Ruff  MD; 03/29/2018 4:55 PM) PERIRECTAL ABSCESS (K61.1) Impression: Patient appears to have a chronic perianal abscess. This may be developing into a fistula. His CT scan does not show a large fluid collection and this is consistent with his physical exam. We discussed performing an I&D in the operating room versus trying a round of antibiotics. He tried the antibiotics first, but did not see any improvement.  We will proceed with operative I&D.  Rosario Adie, MD  [facility]

## 2018-04-16 NOTE — Anesthesia Preprocedure Evaluation (Addendum)
Anesthesia Evaluation  Patient identified by MRN, date of birth, ID band Patient awake    Reviewed: Allergy & Precautions, NPO status , Patient's Chart, lab work & pertinent test results  History of Anesthesia Complications Negative for: history of anesthetic complications  Airway Mallampati: I  TM Distance: >3 FB Neck ROM: Full    Dental no notable dental hx.    Pulmonary neg pulmonary ROS, former smoker,    Pulmonary exam normal        Cardiovascular negative cardio ROS Normal cardiovascular exam     Neuro/Psych negative neurological ROS  negative psych ROS   GI/Hepatic Neg liver ROS, Ulcerative colitis   Endo/Other  negative endocrine ROS  Renal/GU negative Renal ROS  negative genitourinary   Musculoskeletal negative musculoskeletal ROS (+)   Abdominal   Peds  Hematology negative hematology ROS (+)   Anesthesia Other Findings   Reproductive/Obstetrics                            Anesthesia Physical Anesthesia Plan  ASA: II  Anesthesia Plan: MAC   Post-op Pain Management:    Induction:   PONV Risk Score and Plan: 1 and Propofol infusion and Treatment may vary due to age or medical condition  Airway Management Planned: Natural Airway, Nasal Cannula and Simple Face Mask  Additional Equipment: None  Intra-op Plan:   Post-operative Plan:   Informed Consent: I have reviewed the patients History and Physical, chart, labs and discussed the procedure including the risks, benefits and alternatives for the proposed anesthesia with the patient or authorized representative who has indicated his/her understanding and acceptance.     Plan Discussed with:   Anesthesia Plan Comments:        Anesthesia Quick Evaluation

## 2018-04-19 ENCOUNTER — Encounter (HOSPITAL_BASED_OUTPATIENT_CLINIC_OR_DEPARTMENT_OTHER): Payer: Self-pay | Admitting: General Surgery

## 2018-04-29 DIAGNOSIS — K6289 Other specified diseases of anus and rectum: Secondary | ICD-10-CM | POA: Diagnosis not present

## 2018-04-29 DIAGNOSIS — K5289 Other specified noninfective gastroenteritis and colitis: Secondary | ICD-10-CM | POA: Diagnosis not present

## 2018-04-29 DIAGNOSIS — K5190 Ulcerative colitis, unspecified, without complications: Secondary | ICD-10-CM | POA: Diagnosis not present

## 2018-05-05 DIAGNOSIS — K5289 Other specified noninfective gastroenteritis and colitis: Secondary | ICD-10-CM | POA: Diagnosis not present

## 2018-05-11 DIAGNOSIS — Z472 Encounter for removal of internal fixation device: Secondary | ICD-10-CM | POA: Diagnosis not present

## 2018-05-11 DIAGNOSIS — Z967 Presence of other bone and tendon implants: Secondary | ICD-10-CM | POA: Diagnosis not present

## 2018-05-11 DIAGNOSIS — T8484XA Pain due to internal orthopedic prosthetic devices, implants and grafts, initial encounter: Secondary | ICD-10-CM | POA: Diagnosis not present

## 2018-06-18 DIAGNOSIS — J069 Acute upper respiratory infection, unspecified: Secondary | ICD-10-CM | POA: Diagnosis not present

## 2018-06-28 DIAGNOSIS — H903 Sensorineural hearing loss, bilateral: Secondary | ICD-10-CM | POA: Diagnosis not present

## 2018-07-13 ENCOUNTER — Other Ambulatory Visit: Payer: Self-pay | Admitting: General Surgery

## 2018-07-13 NOTE — H&P (Signed)
History of Present Illness Leighton Ruff MD; 1/76/1607 10:11 AM) The patient is a 48 year old male who presents with anal pain. 48 year old male who presented to the office with pain around his tailbone. He states that he developed a mass in the area with some induration. He developed fevers and was seen by gastroenterology at [place]. Due to the level of pain he was having it was thought to be a fissure and he was placed on diltiazem ointment. His symptoms did improve with that but he was unable to completely get rid of his pain. He was seen back at the GI doctor again and a CT scan was ordered. This showed some inflammation within the ischiorectal space in the right side. Patient does have a history of ulcerative colitis. He is having regular bowel movements and denies any diarrhea currently. He was taken to the operating room in late October 2019 where an incision and drainage of perirectal abscess was performed. There was no direct communication to the anterior midline consistent with fistula at that time. Unfortunately he reports that he continues to have drainage on a regular basis from his wound. He is concerned for possible fistula.   Problem List/Past Medical Leighton Ruff, MD; 3/71/0626 10:11 AM) Kathi Der ABSCESS (K61.1)  Past Surgical History Leighton Ruff, MD; 9/48/5462 10:11 AM) Foot Surgery Left. Hemorrhoidectomy  Allergies (Tanisha A. Owens Shark, [place]; 07/13/2018 9:45 AM) Levaquin *FLUOROQUINOLONES* NSAIDs Allergies Reconciled  Medication History (Tanisha A. Owens Shark, [place]; 07/13/2018 9:45 AM) azaTHIOprine (50MG Tablet, Oral) Active. Aprezo Active. Medications Reconciled  Social History Leighton Ruff, MD; 12/17/5007 10:11 AM) Alcohol use Occasional alcohol use. Caffeine use Tea. Tobacco use Former smoker.  Other Problems Leighton Ruff, MD; 3/81/8299 10:11 AM) Back Pain Gastroesophageal Reflux Disease     Review of Systems Leighton Ruff MD;  3/71/6967 10:12 AM) HEENT Not Present- Earache, Hearing Loss, Hoarseness, Nose Bleed, Oral Ulcers, Ringing in the Ears, Seasonal Allergies, Sinus Pain, Sore Throat, Visual Disturbances, Wears glasses/contact lenses and Yellow Eyes. Respiratory Not Present- Bloody sputum, Chronic Cough, Difficulty Breathing, Snoring and Wheezing. Breast Not Present- Breast Mass, Breast Pain, Nipple Discharge and Skin Changes. Gastrointestinal Present- Rectal Pain. Not Present- Abdominal Pain, Bloating, Bloody Stool, Change in Bowel Habits, Chronic diarrhea, Constipation, Difficulty Swallowing, Excessive gas, Gets full quickly at meals, Hemorrhoids, Indigestion, Nausea and Vomiting. Male Genitourinary Not Present- Blood in Urine, Change in Urinary Stream, Frequency, Impotence, Nocturia, Painful Urination, Urgency and Urine Leakage.  Vitals (Tanisha A. Brown RMA; 07/13/2018 9:45 AM) 07/13/2018 9:45 AM Weight: 189.8 lb Height: 72in Body Surface Area: 2.08 m Body Mass Index: 25.74 kg/m  Temp.: 98.17F  Pulse: 92 (Regular)  BP: 132/82 (Sitting, Left Arm, Standard)      Physical Exam Leighton Ruff MD; 8/93/8101 10:12 AM)  General Mental Status-Alert. General Appearance-Not in acute distress. Build & Nutrition-Well nourished. Posture-Normal posture. Gait-Normal.  Head and Neck Head-normocephalic, atraumatic with no lesions or palpable masses. Trachea-midline.  Chest and Lung Exam Chest and lung exam reveals -on auscultation, normal breath sounds, no adventitious sounds and normal vocal resonance.  Cardiovascular Cardiovascular examination reveals -normal heart sounds, regular rate and rhythm with no murmurs and no digital clubbing, cyanosis, edema, increased warmth or tenderness.  Abdomen Inspection Inspection of the abdomen reveals - No Hernias. Palpation/Percussion Palpation and Percussion of the abdomen reveal - Soft, Non Tender, No Rigidity (guarding), No  hepatosplenomegaly and No Palpable abdominal masses.  Rectal Note: ext opening in right posterior ischio-anal space with tenderness to palpation consistent with fistula tract  Neurologic Neurologic evaluation reveals -alert and oriented x 3 with no impairment of recent or remote memory, normal attention span and ability to concentrate, normal sensation and normal coordination.  Musculoskeletal Normal Exam - Bilateral-Upper Extremity Strength Normal and Lower Extremity Strength Normal.    Assessment & Plan Leighton Ruff MD; 0/93/1121 10:15 AM)  ANAL FISTULA (K60.3) Impression: 48 year old male who presents to the office for evaluation of perianal drainage. He is status post I&D of a perirectal abscess in October 2019. He has had consistent drainage from the opening since then. On exam today he has a right posterior lateral external opening. The fistula probe easily inserts towards the anal canal. We discussed the treatment for this. Given that he has ulcerative colitis, I would like to take a more conservative approach and most likely place a seton. We discussed that we would only perform a fistulotomy if minimal sphincter complex was involved. We discussed that if I did perform a sphincterotomy he would most likely have worsening urgency and possible incontinence with loose stools and diarrhea.

## 2018-08-19 DIAGNOSIS — R0789 Other chest pain: Secondary | ICD-10-CM | POA: Diagnosis not present

## 2018-08-20 ENCOUNTER — Encounter (HOSPITAL_BASED_OUTPATIENT_CLINIC_OR_DEPARTMENT_OTHER): Payer: Self-pay

## 2018-08-20 ENCOUNTER — Ambulatory Visit (HOSPITAL_BASED_OUTPATIENT_CLINIC_OR_DEPARTMENT_OTHER): Admit: 2018-08-20 | Payer: BLUE CROSS/BLUE SHIELD | Admitting: General Surgery

## 2018-08-20 SURGERY — EXAM UNDER ANESTHESIA WITH ANAL FISTULECTOMY
Anesthesia: Monitor Anesthesia Care

## 2018-08-25 ENCOUNTER — Other Ambulatory Visit: Payer: Self-pay

## 2018-08-25 ENCOUNTER — Ambulatory Visit (INDEPENDENT_AMBULATORY_CARE_PROVIDER_SITE_OTHER): Payer: BLUE CROSS/BLUE SHIELD | Admitting: Cardiology

## 2018-08-25 ENCOUNTER — Encounter (INDEPENDENT_AMBULATORY_CARE_PROVIDER_SITE_OTHER): Payer: Self-pay

## 2018-08-25 VITALS — BP 118/74 | HR 65 | Ht 72.0 in | Wt 182.1 lb

## 2018-08-25 DIAGNOSIS — R079 Chest pain, unspecified: Secondary | ICD-10-CM | POA: Diagnosis not present

## 2018-08-25 DIAGNOSIS — E7800 Pure hypercholesterolemia, unspecified: Secondary | ICD-10-CM | POA: Diagnosis not present

## 2018-08-25 DIAGNOSIS — E785 Hyperlipidemia, unspecified: Secondary | ICD-10-CM | POA: Insufficient documentation

## 2018-08-25 MED ORDER — METOPROLOL TARTRATE 100 MG PO TABS: ORAL_TABLET | ORAL | 0 refills | Status: DC

## 2018-08-25 NOTE — Progress Notes (Signed)
Cardiology Office Note    Date:  08/25/2018   ID:  Marc Mercado, DOB 10-Mar-1971, MRN [id number]  PCP:  Marc Jordan, MD  Cardiologist:  Marc Him, MD   Chief Complaint  Patient presents with  . New Patient (Initial Visit)    chest pain    History of Present Illness:  Marc Mercado is a 48 y.o. male who is being seen today for the evaluation of chest pain at the request of Marc Jordan, MD.  This is a 48 year old male with a history of hyperlipidemia and ulcerative colitis as well as GERD who recently saw his PCP with complaints of chest pressure.  A few weeks ago while driving he developed a strange discomfort in his chest that he described as a pressure sensation.  He got very short of breath and had to roll down the window to get air.  This happened several more times later that evening.  He says it was mainly midsternal and felt like someone was squeezing his chest.  It was worse that he took a deep breath in.  He has a history of hyperlipidemia with an LDL of 180 recently.  His pain usually occurs without exertion usually when sitting at work or in his truck or at home and lasts a few minutes at a time.  His pain also tends to be later in the day.  If he focuses on breathing and relaxes the discomfort goes away on its own.  He says he will get a sensation in his throat that his throat is achy and closing up.  He denies any diaphoresis, nausea during the episodes does feel like there is a lump in his throat and is hard to swallow..  He denies any PND orthopnea.  He has had no nighttime symptoms.  There is no family history of CAD.  He denies any tobacco use but was exposed to secondhand smoke.  EKG at PCP office showed normal sinus rhythm with no ST changes.  He is now referred for further evaluation.  Past Medical History:  Diagnosis Date  . Anal fistula   . Anemia   . Blood transfusion AGE 36  . Chest pressure   . Generalized headaches   . GERD (gastroesophageal reflux  disease)   . Hemorrhoids   . Hernia, inguinal, right   . History of stomach ulcers   . Hypercholesterolemia   . Migraine   . Perirectal abscess   . Ulcerative colitis [facility])     Past Surgical History:  Procedure Laterality Date  . HEMORRHOID SURGERY  2010 and 2012  . INCISION AND DRAINAGE ABSCESS N/A 04/16/2018   Procedure: INCISION AND DRAINAGE OF PERIRECTAL ABSCESS;  Surgeon: Leighton Ruff, MD;  Location: [place];  Service: General;  Laterality: N/A;  . MOUTH SURGERY  2011   GLAND REMOVED FROM BOTTOM LIP ON RIGHT  . SPHINCTETOTOMY    . SPINE SURGERY  2008   L5  . SURGERY FOR FOOT FRACTURE Left 2019  . WRIST SURGERY Left 2018   CYST REMOVED    Current Medications: Current Meds  Medication Sig  . azaTHIOprine (IMURAN) 50 MG tablet Take 100 mg by mouth daily.     Allergies:   Nsaids and Levaquin [levofloxacin]   Social History   Socioeconomic History  . Marital status: Married    Spouse name: Not on file  . Number of children: Not on file  . Years of education: Not on file  .  Highest education level: Not on file  Occupational History  . Not on file  Social Needs  . Financial resource strain: Not on file  . Food insecurity:    Worry: Not on file    Inability: Not on file  . Transportation needs:    Medical: Not on file    Non-medical: Not on file  Tobacco Use  . Smoking status: Former Smoker    Packs/day: 1.00    Years: 15.00    Pack years: 15.00    Types: Cigarettes    Last attempt to quit: 02/27/1999    Years since quitting: 19.5  . Smokeless tobacco: Never Used  Substance and Sexual Activity  . Alcohol use: Yes    Comment: RARE  . Drug use: No  . Sexual activity: Not on file  Lifestyle  . Physical activity:    Days per week: Not on file    Minutes per session: Not on file  . Stress: Not on file  Relationships  . Social connections:    Talks on phone: Not on file    Gets together: Not on file    Attends religious service: Not  on file    Active member of club or organization: Not on file    Attends meetings of clubs or organizations: Not on file    Relationship status: Not on file  Other Topics Concern  . Not on file  Social History Narrative  . Not on file     Family History:  The patient's family history includes Cancer in his father and mother; Colon polyps in his father.   ROS:   Please see the history of present illness.    ROS All other systems reviewed and are negative.  No flowsheet data found.     PHYSICAL EXAM:   VS:  BP 118/74   Pulse 65   Ht 6' (1.829 m)   Wt 182 lb 1.9 oz (82.6 kg)   SpO2 97%   BMI 24.70 kg/m    GEN: Well nourished, well developed, in no acute distress  HEENT: normal  Neck: no JVD, carotid bruits, or masses Cardiac: RRR; no murmurs, rubs, or gallops,no edema.  Intact distal pulses bilaterally.  Respiratory:  clear to auscultation bilaterally, normal work of breathing GI: soft, nontender, nondistended, + BS MS: no deformity or atrophy  Skin: warm and dry, no rash Neuro:  Alert and Oriented x 3, Strength and sensation are intact Psych: euthymic mood, full affect  Wt Readings from Last 3 Encounters:  08/25/18 182 lb 1.9 oz (82.6 kg)  04/16/18 182 lb 7 oz (82.8 kg)  11/25/13 181 lb (82.1 kg)      Studies/Labs Reviewed:   EKG:  EKG is ordered today and showed normal sinus rhythm at 65 bpm with no ST changes.  Recent Labs: No results found for requested labs within last 8760 hours.   Lipid Panel No results found for: CHOL, TRIG, HDL, CHOLHDL, VLDL, LDLCALC, LDLDIRECT  Additional studies/ records that were reviewed today include:  Office notes and EKG from PCP    ASSESSMENT:    1. Chest pain, unspecified type   2. Pure hypercholesterolemia      PLAN:  In order of problems listed above:  1.  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  Usually his episodes occur when he sitting at work  or in his truck or at home.  His symptoms do feel like a squeezing with associated sensation of his throat closing off and difficult to swallow.  It is hard for Mercado to take a deep breath and when he gets the discomfort as well.  There is no associated nausea or diaphoresis.  There is no radiation to his arms or neck.  EKG at PCP office was normal.  He does have cardiac risk factors including hyperlipidemia.  His last LDL was high at 180.  He has no family history of CAD and has no history of tobacco use but was exposed to enhancement.  I have recommended getting  a 2D echocardiogram to assess LV function.  I am also going to get a coronary CTA with morphology and FFR if necessary given his symptoms and markedly elevated LDL.  Coronary calcium score is elevated elevated will need to be more aggressive  treatment of his hyperlipidemia.  2.  Hyperlipidemia - his most recent LDL was 180 which is too high and would like it to be set less than 100.  Would recommend consider starting statin therapy now indefinitely if he has coronary calcium noted.    Medication Adjustments/Labs and Tests Ordered: Current medicines are reviewed at length with the patient today.  Concerns regarding medicines are outlined above.  Medication changes, Labs and Tests ordered today are listed in the Patient Instructions below.  Patient Instructions  Medication Instructions:  Your physician recommends that you continue on your current medications as directed. Please refer to the Current Medication list given to you today.  If you need a refill on your cardiac medications before your next appointment, please call your pharmacy.   Lab work: Future: BMET, 1 week before Cardiac CT.  If you have labs (blood work) drawn today and your tests are completely normal, you will receive your results only by: Marland Kitchen MyChart Message (if you have MyChart) OR . A paper copy in the mail  If you have any lab test that is abnormal or we need to change your treatment, we will call you to review the results.  Testing/Procedures: Your physician has requested that you have an echocardiogram. Echocardiography is a painless test that uses sound waves to create images of your heart. It provides your doctor with information about the size and shape of your heart and how well your heart's chambers and valves are working. This procedure takes approximately one hour. There are no restrictions for this procedure.  Your physician has requested that you have cardiac CT. Cardiac computed tomography (CT) is a painless test that uses an x-ray machine to take clear, detailed pictures of your heart. For further information please visit [facility]uge[facility]iest[facility]tn. Please follow instruction sheet as given.  Follow-Up: As needed, following results.   Please arrive at the [facility] main entrance of [facility] at xx:xx AM (30-45 minutes prior to test start time)  [facility] [place], [place] [address] [phone]  Proceed to the [facility] Radiology Department (First Floor).  Please follow these instructions carefully (unless otherwise directed):  Hold all erectile dysfunction medications at least 48 hours prior to test.  On the Night Before the Test: . Be sure to Drink plenty of water. . Do not consume any caffeinated/decaffeinated beverages or chocolate 12 hours prior to your test. . Do not take any antihistamines 12 hours prior to your test. On the Day of the Test: . Drink plenty of water. Do not drink  any water within one hour of the test. . Do not eat any food 4 hours prior to the test. . You may take your regular medications prior to the test.  . Take metoprolol (Lopressor) 100 mg, two hours prior to test.    After the Test: . Drink plenty of water. . After receiving IV contrast, you may experience a mild flushed feeling. This is normal. . On  occasion, you may experience a mild rash up to 24 hours after the test. This is not dangerous. If this occurs, you can take Benadryl 25 mg and increase your fluid intake. . If you experience trouble breathing, this can be serious. If it is severe call 911 IMMEDIATELY. If it is mild, please call our office. . If you take any of these medications: Glipizide/Metformin, Avandament, Glucavance, please do not take 48 hours after completing test.     Signed, Marc Him, MD  08/25/2018 12:22 PM    [facility] [place], [place], [place]  [address] Phone: [phone]; Fax: [phone]

## 2018-08-25 NOTE — Patient Instructions (Addendum)
Medication Instructions:  Your physician recommends that you continue on your current medications as directed. Please refer to the Current Medication list given to you today.  If you need a refill on your cardiac medications before your next appointment, please call your pharmacy.   Lab work: Future: BMET, 1 week before Cardiac CT.  If you have labs (blood work) drawn today and your tests are completely normal, you will receive your results only by: Marland Kitchen MyChart Message (if you have MyChart) OR . A paper copy in the mail If you have any lab test that is abnormal or we need to change your treatment, we will call you to review the results.  Testing/Procedures: Your physician has requested that you have an echocardiogram. Echocardiography is a painless test that uses sound waves to create images of your heart. It provides your doctor with information about the size and shape of your heart and how well your heart's chambers and valves are working. This procedure takes approximately one hour. There are no restrictions for this procedure.  Your physician has requested that you have cardiac CT. Cardiac computed tomography (CT) is a painless test that uses an x-ray machine to take clear, detailed pictures of your heart. For further information please visit [facility]iest[facility]tn. Please follow instruction sheet as given.  Follow-Up: As needed, following results.   Please arrive at the [facility] main entrance of [facility] at xx:xx AM (30-45 minutes prior to test start time)  [facility] [place], [place] [address] [phone]  Proceed to the [facility] Radiology Department (First Floor).  Please follow these instructions carefully (unless otherwise directed):  Hold all erectile dysfunction medications at least 48 hours prior to test.  On the Night Before the Test: . Be sure to Drink plenty of water. . Do not consume any caffeinated/decaffeinated beverages  or chocolate 12 hours prior to your test. . Do not take any antihistamines 12 hours prior to your test. On the Day of the Test: . Drink plenty of water. Do not drink any water within one hour of the test. . Do not eat any food 4 hours prior to the test. . You may take your regular medications prior to the test.  . Take metoprolol (Lopressor) 100 mg, two hours prior to test.    After the Test: . Drink plenty of water. . After receiving IV contrast, you may experience a mild flushed feeling. This is normal. . On occasion, you may experience a mild rash up to 24 hours after the test. This is not dangerous. If this occurs, you can take Benadryl 25 mg and increase your fluid intake. . If you experience trouble breathing, this can be serious. If it is severe call 911 IMMEDIATELY. If it is mild, please call our office. . If you take any of these medications: Glipizide/Metformin, Avandament, Glucavance, please do not take 48 hours after completing test.

## 2018-08-30 ENCOUNTER — Telehealth: Payer: Self-pay | Admitting: Internal Medicine

## 2018-08-30 NOTE — Telephone Encounter (Signed)
Spoke to pt re echo He is sched for echo and CT  Eval of CP He is on immunsuppressives  With corona virus outbreak Will reschedule at least 2-3 wks from now   Pt agreeable to this  Will forward to Dr Radford Pax as well for review

## 2018-08-30 NOTE — Telephone Encounter (Signed)
Agree thank you 

## 2018-08-31 NOTE — Telephone Encounter (Signed)
Reschedule echo for 3 wks   Will reassess infection conditions at that time and resched if needed

## 2018-09-01 ENCOUNTER — Other Ambulatory Visit (HOSPITAL_COMMUNITY): Payer: BLUE CROSS/BLUE SHIELD

## 2018-09-13 DIAGNOSIS — K603 Anal fistula: Secondary | ICD-10-CM | POA: Diagnosis not present

## 2018-09-13 DIAGNOSIS — K5150 Left sided colitis without complications: Secondary | ICD-10-CM | POA: Diagnosis not present

## 2018-09-13 DIAGNOSIS — R079 Chest pain, unspecified: Secondary | ICD-10-CM | POA: Diagnosis not present

## 2018-10-25 ENCOUNTER — Other Ambulatory Visit (HOSPITAL_COMMUNITY): Payer: BLUE CROSS/BLUE SHIELD

## 2018-10-27 ENCOUNTER — Ambulatory Visit (HOSPITAL_COMMUNITY)
Admission: RE | Admit: 2018-10-27 | Discharge: 2018-10-27 | Disposition: A | Discharge status: Home / Self Care | Payer: BLUE CROSS/BLUE SHIELD | Source: Ambulatory Visit | Attending: Cardiology | Admitting: Cardiology

## 2018-10-27 ENCOUNTER — Other Ambulatory Visit: Payer: Self-pay

## 2018-10-27 DIAGNOSIS — R079 Chest pain, unspecified: Secondary | ICD-10-CM

## 2018-10-27 NOTE — Progress Notes (Signed)
*  PRELIMINARY RESULTS* Echocardiogram 2D Echocardiogram has been performed.  Marc Mercado 10/27/2018, 3:48 PM

## 2018-10-29 ENCOUNTER — Telehealth: Payer: Self-pay

## 2018-10-29 NOTE — Telephone Encounter (Signed)
-----   Message from Sueanne Margarita, MD sent at 10/29/2018  8:55 AM EDT ----- Please let patient know echo was normal

## 2018-10-29 NOTE — Telephone Encounter (Signed)
Notes recorded by Frederik Schmidt, RN on 10/29/2018 at 9:22 AM EDT Left patient a message letting him know that Echo was normal. If questions, he may call. 5/15 ------

## 2018-11-01 ENCOUNTER — Ambulatory Visit: Payer: Self-pay | Admitting: General Surgery

## 2018-11-01 DIAGNOSIS — K603 Anal fistula: Secondary | ICD-10-CM | POA: Diagnosis not present

## 2018-12-13 DIAGNOSIS — K219 Gastro-esophageal reflux disease without esophagitis: Secondary | ICD-10-CM | POA: Diagnosis not present

## 2018-12-13 DIAGNOSIS — K603 Anal fistula: Secondary | ICD-10-CM | POA: Diagnosis not present

## 2018-12-13 DIAGNOSIS — K51519 Left sided colitis with unspecified complications: Secondary | ICD-10-CM | POA: Diagnosis not present

## 2018-12-28 ENCOUNTER — Other Ambulatory Visit (HOSPITAL_COMMUNITY)
Admission: RE | Admit: 2018-12-28 | Discharge: 2018-12-28 | Disposition: A | Discharge status: Home / Self Care | Payer: BC Managed Care – PPO | Source: Ambulatory Visit | Attending: General Surgery | Admitting: General Surgery

## 2018-12-28 DIAGNOSIS — Z1159 Encounter for screening for other viral diseases: Secondary | ICD-10-CM | POA: Diagnosis not present

## 2018-12-29 ENCOUNTER — Encounter (HOSPITAL_BASED_OUTPATIENT_CLINIC_OR_DEPARTMENT_OTHER): Payer: Self-pay | Admitting: *Deleted

## 2018-12-29 ENCOUNTER — Other Ambulatory Visit: Payer: Self-pay

## 2018-12-29 LAB — SARS CORONAVIRUS 2 (TAT 6-24 HRS): SARS Coronavirus 2: NEGATIVE

## 2018-12-29 NOTE — Progress Notes (Signed)
Spoke with patient via telephone for pre op interview. NPO after MN. Patient can take Imuran and Apriso AM of surgery with a sip of water. Patient verbalized understanding that he would have to wear mask. Arrival time 1115.

## 2018-12-30 NOTE — Progress Notes (Signed)
Pt has procedure time change to start at 1230.  Called and spoke w/ pt via phone.  Pt verbalized understanding to arrive at 1030.

## 2018-12-31 ENCOUNTER — Ambulatory Visit (HOSPITAL_BASED_OUTPATIENT_CLINIC_OR_DEPARTMENT_OTHER)
Admission: RE | Admit: 2018-12-31 | Discharge: 2018-12-31 | Disposition: A | Discharge status: Home / Self Care | Payer: BC Managed Care – PPO | Attending: General Surgery | Admitting: General Surgery

## 2018-12-31 ENCOUNTER — Ambulatory Visit (HOSPITAL_BASED_OUTPATIENT_CLINIC_OR_DEPARTMENT_OTHER): Payer: BC Managed Care – PPO | Admitting: Certified Registered"

## 2018-12-31 ENCOUNTER — Other Ambulatory Visit: Payer: Self-pay

## 2018-12-31 ENCOUNTER — Encounter (HOSPITAL_BASED_OUTPATIENT_CLINIC_OR_DEPARTMENT_OTHER)
Admission: RE | Disposition: A | Discharge status: Home / Self Care | Payer: Self-pay | Source: Home / Self Care | Attending: General Surgery

## 2018-12-31 ENCOUNTER — Encounter (HOSPITAL_BASED_OUTPATIENT_CLINIC_OR_DEPARTMENT_OTHER): Payer: Self-pay | Admitting: *Deleted

## 2018-12-31 DIAGNOSIS — K603 Anal fistula: Secondary | ICD-10-CM | POA: Diagnosis not present

## 2018-12-31 DIAGNOSIS — K51919 Ulcerative colitis, unspecified with unspecified complications: Secondary | ICD-10-CM | POA: Diagnosis not present

## 2018-12-31 DIAGNOSIS — Z87891 Personal history of nicotine dependence: Secondary | ICD-10-CM | POA: Insufficient documentation

## 2018-12-31 DIAGNOSIS — K219 Gastro-esophageal reflux disease without esophagitis: Secondary | ICD-10-CM | POA: Insufficient documentation

## 2018-12-31 DIAGNOSIS — E785 Hyperlipidemia, unspecified: Secondary | ICD-10-CM | POA: Diagnosis not present

## 2018-12-31 DIAGNOSIS — Z79899 Other long term (current) drug therapy: Secondary | ICD-10-CM | POA: Diagnosis not present

## 2018-12-31 HISTORY — PX: EVALUATION UNDER ANESTHESIA WITH ANAL FISTULECTOMY: SHX5621

## 2018-12-31 SURGERY — EXAM UNDER ANESTHESIA WITH ANAL FISTULECTOMY
Anesthesia: Monitor Anesthesia Care | Site: Rectum

## 2018-12-31 MED ORDER — ACETAMINOPHEN 500 MG PO TABS
ORAL_TABLET | ORAL | Status: AC
  Filled 2018-12-31: qty 2

## 2018-12-31 MED ORDER — BUPIVACAINE LIPOSOME 1.3 % IJ SUSP
INTRAMUSCULAR | Status: DC | PRN
  Administered 2018-12-31: 20 mL

## 2018-12-31 MED ORDER — OXYCODONE HCL 5 MG PO TABS
5.0000 mg | ORAL_TABLET | ORAL | Status: DC | PRN
  Filled 2018-12-31: qty 2

## 2018-12-31 MED ORDER — SODIUM CHLORIDE 0.9 % IV SOLN
250.0000 mL | INTRAVENOUS | Status: DC | PRN
  Filled 2018-12-31: qty 250

## 2018-12-31 MED ORDER — HYDROCODONE-ACETAMINOPHEN 5-325 MG PO TABS: 1.0000 | ORAL_TABLET | Freq: Four times a day (QID) | ORAL | 0 refills | Status: DC | PRN

## 2018-12-31 MED ORDER — PROPOFOL 500 MG/50ML IV EMUL
INTRAVENOUS | Status: DC | PRN
  Administered 2018-12-31: 175 ug/kg/min via INTRAVENOUS

## 2018-12-31 MED ORDER — FENTANYL CITRATE (PF) 100 MCG/2ML IJ SOLN
25.0000 ug | INTRAMUSCULAR | Status: DC | PRN
  Filled 2018-12-31: qty 1

## 2018-12-31 MED ORDER — LIDOCAINE 2% (20 MG/ML) 5 ML SYRINGE
INTRAMUSCULAR | Status: DC | PRN
  Administered 2018-12-31: 40 mg via INTRAVENOUS

## 2018-12-31 MED ORDER — ACETAMINOPHEN 500 MG PO TABS
1000.0000 mg | ORAL_TABLET | ORAL | Status: AC
  Administered 2018-12-31: 1000 mg via ORAL
  Filled 2018-12-31: qty 2

## 2018-12-31 MED ORDER — ACETAMINOPHEN 650 MG RE SUPP
650.0000 mg | RECTAL | Status: DC | PRN
  Filled 2018-12-31: qty 1

## 2018-12-31 MED ORDER — MIDAZOLAM HCL 2 MG/2ML IJ SOLN
INTRAMUSCULAR | Status: AC
  Filled 2018-12-31: qty 2

## 2018-12-31 MED ORDER — SODIUM CHLORIDE 0.9% FLUSH
3.0000 mL | INTRAVENOUS | Status: DC | PRN
  Filled 2018-12-31: qty 3

## 2018-12-31 MED ORDER — BUPIVACAINE LIPOSOME 1.3 % IJ SUSP
INTRAMUSCULAR | Status: AC
  Filled 2018-12-31: qty 20

## 2018-12-31 MED ORDER — LACTATED RINGERS IV SOLN
INTRAVENOUS | Status: DC
  Administered 2018-12-31: 11:00:00 via INTRAVENOUS
  Filled 2018-12-31: qty 1000

## 2018-12-31 MED ORDER — PROPOFOL 10 MG/ML IV BOLUS
INTRAVENOUS | Status: AC
  Filled 2018-12-31: qty 40

## 2018-12-31 MED ORDER — SODIUM CHLORIDE 0.9% FLUSH
3.0000 mL | Freq: Two times a day (BID) | INTRAVENOUS | Status: DC
  Filled 2018-12-31: qty 3

## 2018-12-31 MED ORDER — PROPOFOL 10 MG/ML IV BOLUS
INTRAVENOUS | Status: DC | PRN
  Administered 2018-12-31: 60 mg via INTRAVENOUS

## 2018-12-31 MED ORDER — PROPOFOL 10 MG/ML IV BOLUS
INTRAVENOUS | Status: AC
  Filled 2018-12-31: qty 20

## 2018-12-31 MED ORDER — ACETAMINOPHEN 10 MG/ML IV SOLN
INTRAVENOUS | Status: AC
  Filled 2018-12-31: qty 100

## 2018-12-31 MED ORDER — MIDAZOLAM HCL 2 MG/2ML IJ SOLN
INTRAMUSCULAR | Status: DC | PRN
  Administered 2018-12-31 (×2): 1 mg via INTRAVENOUS

## 2018-12-31 MED ORDER — LIDOCAINE HCL 1 % IJ SOLN
INTRAMUSCULAR | Status: DC | PRN
  Administered 2018-12-31: 20 mL

## 2018-12-31 MED ORDER — BUPIVACAINE-EPINEPHRINE 0.5% -1:200000 IJ SOLN
INTRAMUSCULAR | Status: DC | PRN
  Administered 2018-12-31: 30 mL

## 2018-12-31 MED ORDER — ACETAMINOPHEN 325 MG PO TABS
650.0000 mg | ORAL_TABLET | ORAL | Status: DC | PRN
  Filled 2018-12-31: qty 2

## 2018-12-31 SURGICAL SUPPLY — 53 items
APL SKNCLS STERI-STRIP NONHPOA (GAUZE/BANDAGES/DRESSINGS) ×1
BENZOIN TINCTURE PRP APPL 2/3 (GAUZE/BANDAGES/DRESSINGS) ×4 IMPLANT
BLADE EXTENDED COATED 6.5IN (ELECTRODE) IMPLANT
BLADE HEX COATED 2.75 (ELECTRODE) ×3 IMPLANT
BLADE SURG 10 STRL SS (BLADE) IMPLANT
BRIEF STRETCH FOR OB PAD LRG (UNDERPADS AND DIAPERS) ×3 IMPLANT
CANISTER SUCT 3000ML PPV (MISCELLANEOUS) ×3 IMPLANT
COVER BACK TABLE 60X90IN (DRAPES) ×3 IMPLANT
COVER MAYO STAND STRL (DRAPES) ×3 IMPLANT
COVER WAND RF STERILE (DRAPES) ×3 IMPLANT
DECANTER SPIKE VIAL GLASS SM (MISCELLANEOUS) ×1 IMPLANT
DRAPE LAPAROTOMY 100X72 PEDS (DRAPES) ×3 IMPLANT
DRAPE UTILITY XL STRL (DRAPES) ×3 IMPLANT
ELECT REM PT RETURN 9FT ADLT (ELECTROSURGICAL) ×3
ELECTRODE REM PT RTRN 9FT ADLT (ELECTROSURGICAL) ×1 IMPLANT
GAUZE SPONGE 4X4 12PLY STRL (GAUZE/BANDAGES/DRESSINGS) ×3 IMPLANT
GAUZE SPONGE 4X4 12PLY STRL LF (GAUZE/BANDAGES/DRESSINGS) ×2 IMPLANT
GLOVE BIO SURGEON STRL SZ 6.5 (GLOVE) ×2 IMPLANT
GLOVE BIO SURGEONS STRL SZ 6.5 (GLOVE) ×1
GLOVE BIOGEL PI IND STRL 7.0 (GLOVE) ×1 IMPLANT
GLOVE BIOGEL PI INDICATOR 7.0 (GLOVE) ×2
GOWN SPEC L3 XXLG W/TWL (GOWN DISPOSABLE) ×3 IMPLANT
HYDROGEN PEROXIDE 16OZ (MISCELLANEOUS) ×1 IMPLANT
IV CATH 14GX2 1/4 (CATHETERS) ×3 IMPLANT
IV CATH 18G SAFETY (IV SOLUTION) ×3 IMPLANT
KIT SIGMOIDOSCOPE (SET/KITS/TRAYS/PACK) IMPLANT
KIT TURNOVER CYSTO (KITS) ×3 IMPLANT
LOOP VESSEL MAXI BLUE (MISCELLANEOUS) IMPLANT
NEEDLE HYPO 22GX1.5 SAFETY (NEEDLE) ×3 IMPLANT
NS IRRIG 500ML POUR BTL (IV SOLUTION) ×3 IMPLANT
PACK BASIN DAY SURGERY FS (CUSTOM PROCEDURE TRAY) ×3 IMPLANT
PAD ABD 8X10 STRL (GAUZE/BANDAGES/DRESSINGS) ×3 IMPLANT
PAD ARMBOARD 7.5X6 YLW CONV (MISCELLANEOUS) IMPLANT
PENCIL BUTTON HOLSTER BLD 10FT (ELECTRODE) ×3 IMPLANT
SPONGE HEMORRHOID 8X3CM (HEMOSTASIS) IMPLANT
SPONGE SURGIFOAM ABS GEL 12-7 (HEMOSTASIS) IMPLANT
SUCTION FRAZIER HANDLE 10FR (MISCELLANEOUS)
SUCTION TUBE FRAZIER 10FR DISP (MISCELLANEOUS) IMPLANT
SUT CHROMIC 2 0 SH (SUTURE) ×2 IMPLANT
SUT CHROMIC 3 0 SH 27 (SUTURE) IMPLANT
SUT ETHIBOND 0 (SUTURE) IMPLANT
SUT VIC AB 2-0 SH 27 (SUTURE)
SUT VIC AB 2-0 SH 27XBRD (SUTURE) IMPLANT
SUT VIC AB 3-0 SH 18 (SUTURE) IMPLANT
SUT VIC AB 3-0 SH 27 (SUTURE) ×3
SUT VIC AB 3-0 SH 27X BRD (SUTURE) IMPLANT
SUT VIC AB 3-0 SH 27XBRD (SUTURE) IMPLANT
SYR CONTROL 10ML LL (SYRINGE) ×3 IMPLANT
TOWEL OR 17X26 10 PK STRL BLUE (TOWEL DISPOSABLE) ×3 IMPLANT
TRAY DSU PREP LF (CUSTOM PROCEDURE TRAY) ×3 IMPLANT
TUBE CONNECTING 12'X1/4 (SUCTIONS) ×1
TUBE CONNECTING 12X1/4 (SUCTIONS) ×2 IMPLANT
YANKAUER SUCT BULB TIP NO VENT (SUCTIONS) ×3 IMPLANT

## 2018-12-31 NOTE — Op Note (Signed)
12/31/2018  12:02 PM  PATIENT:  Marc Mercado  48 y.o. male  Patient Care Team: Jonathon Jordan, MD as PCP - General (Family Medicine)  PRE-OPERATIVE DIAGNOSIS:  anal fistula  POST-OPERATIVE DIAGNOSIS:  anal fistula  PROCEDURE:   ANAL EXAM UNDER ANESTHESIA WITH ANAL FISTULOTOMY    Surgeon(s): Leighton Ruff, MD  ASSISTANT: none   ANESTHESIA:   local and MAC  SPECIMEN:  No Specimen  DISPOSITION OF SPECIMEN:  N/A  COUNTS:  YES  PLAN OF CARE: Discharge to home after PACU  PATIENT DISPOSITION:  PACU - hemodynamically stable.  INDICATION: 48 y.o. M with UC who presented to the office with an anal fistula   OR FINDINGS: anal fistula involving superficial external spincter only  DESCRIPTION: the patient was identified in the preoperative holding area and taken to the OR where they were laid on the operating room table.  MAC anesthesia was induced without difficulty. The patient was then positioned in prone jackknife position with buttocks gently taped apart.  The patient was then prepped and draped in usual sterile fashion.  SCDs were noted to be in place prior to the initiation of anesthesia. A surgical timeout was performed indicating the correct patient, procedure, positioning and need for preoperative antibiotics.  A rectal block was performed using Marcaine with epinephrine mixed with Experel.    I began with a digital rectal exam.  The patient had mild sphincter hypertension.  I then placed a small Hill-Ferguson anoscope into the anal canal and evaluated this completely.  There was an obvious internal opening in the very proximal anal canal.  This appeared to be exiting from the intersphincteric groove.  There was no inflammation noted in the distal rectum or anal canal.  I dilated the anal canal to 2 fingerbreadths and placed a medium sized anoscope.  I then was easily able to place the fistula probe through the fistula tract.  This exited at the internal sphincter.  There  was no internal sphincter involvement.  There was superficial external sphincter involvement.  I decided to perform a fistulotomy.  This was done with Bovie electrocautery.  I then reapproximated the external sphincter to the fistula tract using interrupted 3-0 Vicryl sutures.  The edges of the fistulotomy were then marsupialized to the fistula tract using a running 2-0 chromic suture.  A dressing was then applied.  The patient was awakened from anesthesia and sent to the postanesthesia care unit in stable condition.  All counts were correct per operating room staff.

## 2018-12-31 NOTE — Anesthesia Postprocedure Evaluation (Signed)
Anesthesia Post Note  Patient: Marc Mercado  Procedure(s) Performed: ANAL EXAM UNDER ANESTHESIA WITH ANAL FISTULOTOMY (N/A Rectum)     Patient location during evaluation: PACU Anesthesia Type: MAC Level of consciousness: awake Pain management: pain level controlled Vital Signs Assessment: post-procedure vital signs reviewed and stable Respiratory status: spontaneous breathing Cardiovascular status: stable Postop Assessment: no apparent nausea or vomiting Anesthetic complications: no    Last Vitals:  Vitals:   12/31/18 1245 12/31/18 1325  BP: 123/86 116/89  Pulse: 67 (!) 59  Resp: 14 16  Temp:  36.6 C  SpO2: 100% 100%    Last Pain:  Vitals:   12/31/18 1325  TempSrc:   PainSc: 0-No pain                 Aubrey Voong

## 2018-12-31 NOTE — Anesthesia Preprocedure Evaluation (Addendum)
Anesthesia Evaluation  Patient identified by MRN, date of birth, ID band Patient awake    Reviewed: Allergy & Precautions, NPO status , Patient's Chart, lab work & pertinent test results  Airway Mallampati: II  TM Distance: >3 FB     Dental   Pulmonary former smoker,    breath sounds clear to auscultation       Cardiovascular negative cardio ROS   Rhythm:Regular Rate:Normal     Neuro/Psych    GI/Hepatic Neg liver ROS, PUD, GERD  ,  Endo/Other    Renal/GU negative Renal ROS     Musculoskeletal   Abdominal   Peds  Hematology  (+) anemia ,   Anesthesia Other Findings   Reproductive/Obstetrics                             Anesthesia Physical Anesthesia Plan  ASA: II  Anesthesia Plan: MAC   Post-op Pain Management:    Induction: Intravenous  PONV Risk Score and Plan: 1 and Ondansetron, Dexamethasone and Midazolam  Airway Management Planned: Simple Face Mask and Nasal Cannula  Additional Equipment:   Intra-op Plan:   Post-operative Plan:   Informed Consent: I have reviewed the patients History and Physical, chart, labs and discussed the procedure including the risks, benefits and alternatives for the proposed anesthesia with the patient or authorized representative who has indicated his/her understanding and acceptance.     Dental advisory given  Plan Discussed with: CRNA and Anesthesiologist  Anesthesia Plan Comments:         Anesthesia Quick Evaluation

## 2018-12-31 NOTE — Discharge Instructions (Signed)
ANORECTAL SURGERY: POST OP INSTRUCTIONS 1. Take your usually prescribed home medications unless otherwise directed. 2. DIET: During the first few hours after surgery sip on some liquids until you are able to urinate.  It is normal to not urinate for several hours after this surgery.  If you feel uncomfortable, please contact the office for instructions.  After you are able to urinate,you may eat, if you feel like it.  Follow a light bland diet the first 24 hours after arrival home, such as soup, liquids, crackers, etc.  Be sure to include lots of fluids daily (6-8 glasses).  Avoid fast food or heavy meals, as your are more likely to get nauseated.  Eat a low fat diet the next few days after surgery.  Limit caffeine intake to 1-2 servings a day. 3. PAIN CONTROL: a. Pain is best controlled by a usual combination of several different methods TOGETHER: i. Muscle relaxation: Soak in a warm bath (or Sitz bath) three times a day and after bowel movements.  Continue to do this until all pain is resolved. ii. Over the counter pain medication iii. Prescription pain medication b. Most patients will experience some swelling and discomfort in the anus/rectal area and incisions.  Heat such as warm towels, sitz baths, warm baths, etc to help relax tight/sore spots and speed recovery.  Some people prefer to use ice, especially in the first couple days after surgery, as it may decrease the pain and swelling, or alternate between ice & heat.  Experiment to what works for you.  Swelling and bruising can take several weeks to resolve.  Pain can take even longer to completely resolve. c. It is helpful to take an over-the-counter pain medication regularly for the first few weeks.  Choose one of the following that works best for you: i. Naproxen (Aleve, etc)  Two 225m tabs twice a day ii. Ibuprofen (Advil, etc) Three 2016mtabs four times a day (every meal & bedtime) d. A  prescription for pain medication (such as percocet,  oxycodone, hydrocodone, etc) should be given to you upon discharge.  Take your pain medication as prescribed.  i. If you are having problems/concerns with the prescription medicine (does not control pain, nausea, vomiting, rash, itching, etc), please call [phone] see if we need to switch you to a different pain medicine that will work better for you and/or control your side effect better. ii. If you need a refill on your pain medication, please contact your pharmacy.  They will contact our office to request authorization. Prescriptions will not be filled after 5 pm or on week-ends. 4. KEEP YOUR BOWELS REGULAR and AVOID CONSTIPATION a. The goal is one to two soft bowel movements a day.  You should at least have a bowel movement every other day. b. Avoid getting constipated.  Between the surgery and the pain medications, it is common to experience some constipation. This can be very painful after rectal surgery.  Increasing fluid intake and taking a fiber supplement (such as Metamucil, Citrucel, FiberCon, etc) 1-2 times a day regularly will usually help prevent this problem from occurring.  A stool softener like colace is also recommended.  This can be purchased over the counter at your pharmacy.  You can take it up to 3 times a day.  If you do not have a bowel movement after 24 hrs since your surgery, take one does of milk of magnesia.  If you still haven't had a bowel movement 8-12 hours after  that dose, take another dose.  If you don't have a bowel movement 48 hrs after surgery, purchase a Fleets enema from the drug store and administer gently per package instructions.  If you still are having trouble with your bowel movements after that, please call the office for further instructions. c. If you develop diarrhea or have many loose bowel movements, simplify your diet to bland foods & liquids for a few days.  Stop any stool softeners and decrease your fiber supplement.  Switching to mild  anti-diarrheal medications (Kayopectate, Pepto Bismol) can help.  If this worsens or does not improve, please call us.  5. Wound Care a. Remove your bandages before your first bowel movement or 8 hours after surgery.     b. Remove any wound packing material at this tim,e as well.  You do not need to repack the wound unless instructed otherwise.  Wear an absorbent pad or soft cotton gauze in your underwear to catch any drainage and help keep the area clean. You should change this every 2-3 hours while awake. c. Keep the area clean and dry.  Bathe / shower every day, especially after bowel movements.  Keep the area clean by showering / bathing over the incision / wound.   It is okay to soak an open wound to help wash it.  Wet wipes or showers / gentle washing after bowel movements is often less traumatic than regular toilet paper. d. Dennis Bast may have some styrofoam-like soft packing in the rectum which will come out with the first bowel movement.  e. You will often notice bleeding with bowel movements.  This should slow down by the end of the first week of surgery f. Expect some drainage.  This should slow down, too, by the end of the first week of surgery.  Wear an absorbent pad or soft cotton gauze in your underwear until the drainage stops. g. Do Not sit on a rubber or pillow ring.  This can make you symptoms worse.  You may sit on a soft pillow if needed.  6. ACTIVITIES as tolerated:   a. You may resume regular (light) daily activities beginning the next day--such as daily self-care, walking, climbing stairs--gradually increasing activities as tolerated.  If you can walk 30 minutes without difficulty, it is safe to try more intense activity such as jogging, treadmill, bicycling, low-impact aerobics, swimming, etc. b. Save the most intensive and strenuous activity for last such as sit-ups, heavy lifting, contact sports, etc  Refrain from any heavy lifting or straining until you are off narcotics for pain  control.   c. You may drive when you are no longer taking prescription pain medication, you can comfortably sit for long periods of time, and you can safely maneuver your car and apply brakes. d. Dennis Bast may have sexual intercourse when it is comfortable.  7. FOLLOW UP in our office a. Please call [facility] at [phone] to set up an appointment to see your surgeon in the office for a follow-up appointment approximately 3-4 weeks after your surgery. b. Make sure that you call for this appointment the day you arrive home to insure a convenient appointment time. 10. IF YOU HAVE DISABILITY OR FAMILY LEAVE FORMS, BRING THEM TO THE OFFICE FOR PROCESSING.  DO NOT GIVE THEM TO YOUR DOCTOR.     WHEN TO CALL us [phone]: 1. Poor pain control 2. Reactions / problems with new medications (rash/itching, nausea, etc)  3. Fever over 101.5 F (38.5 C) 4.  Inability to urinate 5. Nausea and/or vomiting 6. Worsening swelling or bruising 7. Continued bleeding from incision. 8. Increased pain, redness, or drainage from the incision  The clinic staff is available to answer your questions during regular business hours (8:30am-5pm).  Please dont hesitate to call and ask to speak to one of our nurses for clinical concerns.   A surgeon from [facility] is always on call at the hospitals   If you have a medical emergency, go to the nearest emergency room or call 911.    [facility], [place], [place], [place], [place]  [address] ? MAIN: [phone] ? TOLL FREE: [phone] ? FAX [phone] [email or web address]     Post Anesthesia Home Care Instructions  Activity: Get plenty of rest for the remainder of the day. A responsible individual must stay with you for 24 hours following the procedure.  For the next 24 hours, DO NOT: -Drive a car -Paediatric nurse -Drink alcoholic beverages -Take any medication unless instructed by your  physician -Make any legal decisions or sign important papers.  Meals: Start with liquid foods such as gelatin or soup. Progress to regular foods as tolerated. Avoid greasy, spicy, heavy foods. If nausea and/or vomiting occur, drink only clear liquids until the nausea and/or vomiting subsides. Call your physician if vomiting continues.  Special Instructions/Symptoms: Your throat may feel dry or sore from the anesthesia or the breathing tube placed in your throat during surgery. If this causes discomfort, gargle with warm salt water. The discomfort should disappear within 24 hours.  If you had a scopolamine patch placed behind your ear for the management of post- operative nausea and/or vomiting:  1. The medication in the patch is effective for 72 hours, after which it should be removed.  Wrap patch in a tissue and discard in the trash. Wash hands thoroughly with soap and water. 2. You may remove the patch earlier than 72 hours if you experience unpleasant side effects which may include dry mouth, dizziness or visual disturbances. 3. Avoid touching the patch. Wash your hands with soap and water after contact with the patch.      Information for Discharge Teaching: EXPAREL (bupivacaine liposome injectable suspension)   Your surgeon gave you EXPAREL(bupivacaine) in your surgical incision to help control your pain after surgery.   EXPAREL is a local anesthetic that provides pain relief by numbing the tissue around the surgical site.  EXPAREL is designed to release pain medication over time and can control pain for up to 72 hours.  Depending on how you respond to EXPAREL, you may require less pain medication during your recovery.  Possible side effects:  Temporary loss of sensation or ability to move in the area where bupivacaine was injected.  Nausea, vomiting, constipation  Rarely, numbness and tingling in your mouth or lips, lightheadedness, or anxiety may occur.  Call your doctor  right away if you think you may be experiencing any of these sensations, or if you have other questions regarding possible side effects.  Follow all other discharge instructions given to you by your surgeon or nurse. Eat a healthy diet and drink plenty of water or other fluids.  If you return to the hospital for any reason within 96 hours following the administration of EXPAREL, please inform your health care providers.

## 2018-12-31 NOTE — Transfer of Care (Signed)
Immediate Anesthesia Transfer of Care Note  Patient: Marc Mercado  Procedure(s) Performed: Procedure(s) (LRB): ANAL EXAM UNDER ANESTHESIA WITH ANAL FISTULOTOMY (N/A)  Patient Location: PACU  Anesthesia Type: MAC  Level of Consciousness: awake, alert , oriented and patient cooperative  Airway & Oxygen Therapy: Patient Spontanous Breathing and Patient connected to face mask oxygen  Post-op Assessment: Report given to PACU RN and Post -op Vital signs reviewed and stable  Post vital signs: Reviewed and stable  Complications: No apparent anesthesia complications  Last Vitals:  Vitals Value Taken Time  BP 103/67 12/31/18 1211  Temp    Pulse 73 12/31/18 1214  Resp 11 12/31/18 1214  SpO2 100 % 12/31/18 1214  Vitals shown include unvalidated device data.  Last Pain:  Vitals:   12/31/18 1023  TempSrc: Oral

## 2018-12-31 NOTE — H&P (Signed)
History of Present Illness  The patient is a 48 year old male who presents with anal pain.48 year old male who presented to the office with pain around his tailbone. He states that he developed a mass in the area with some induration. He developed fevers and was seen by gastroenterology at [facility]. Due to the level of pain he was having it was thought to be a fissure and he was placed on diltiazem ointment. His symptoms did improve with that but he was unable to completely get rid of his pain. He was seen back at the GI doctor again and a CT scan was ordered. This showed some inflammation within the ischiorectal space in the right side. Patient does have a history of ulcerative colitis. He is having regular bowel movements and denies any diarrhea currently. He was taken to the operating room in late October 2019 where an incision and drainage of perirectal abscess was performed. There was no direct communication to the anterior midline consistent with fistula at that time.    Problem List/Past Medical Leighton Ruff, MD; 2/83/6629 4:55 PM) ANAL FISTULA (K60.3)   Past Surgical History Leighton Ruff, MD; 4/76/5465 4:55 PM) Foot Surgery  Left. Hemorrhoidectomy   Allergies Mammie Lorenzo, LPN; 0/35/4656 8:12 PM) Levaquin *FLUOROQUINOLONES*  NSAIDs   Medication History Mammie Lorenzo, LPN; 7/51/7001 7:49 PM) azaTHIOprine (50MG Tablet, Oral) Active. Aprezo Active. Medications Reconciled  Social History Leighton Ruff, MD; 4/49/6759 4:55 PM) Alcohol use  Occasional alcohol use. Caffeine use  Tea. Tobacco use  Former smoker.  Other Problems Leighton Ruff, MD; 1/63/8466 4:55 PM) Back Pain  Gastroesophageal Reflux Disease     Review of Systems Leighton Ruff MD; 5/99/3570 4:55 PM) HEENT Not Present- Earache, Hearing Loss, Hoarseness, Nose Bleed, Oral Ulcers, Ringing in the Ears, Seasonal Allergies, Sinus Pain, Sore Throat, Visual Disturbances, Wears  glasses/contact lenses and Yellow Eyes. Respiratory Not Present- Bloody sputum, Chronic Cough, Difficulty Breathing, Snoring and Wheezing. Breast Not Present- Breast Mass, Breast Pain, Nipple Discharge and Skin Changes. Gastrointestinal Present- Rectal Pain. Not Present- Abdominal Pain, Bloating, Bloody Stool, Change in Bowel Habits, Chronic diarrhea, Constipation, Difficulty Swallowing, Excessive gas, Gets full quickly at meals, Hemorrhoids, Indigestion, Nausea and Vomiting. Male Genitourinary Not Present- Blood in Urine, Change in Urinary Stream, Frequency, Impotence, Nocturia, Painful Urination, Urgency and Urine Leakage.  BP 119/81   Pulse 67   Temp 98.4 F (36.9 C) (Oral)   Resp 16   Ht 6' (1.829 m)   Wt 82.5 kg   SpO2 100%   BMI 24.66 kg/m      Physical Exam  General Mental Status-Alert. General Appearance-Not in acute distress. Build & Nutrition-Well nourished. Posture-Normal posture. Gait-Normal.  Head and Neck Head-normocephalic, atraumatic with no lesions or palpable masses. Trachea-midline.  Chest and Lung Exam Chest and lung exam reveals -on auscultation, normal breath sounds, no adventitious sounds and normal vocal resonance.  Cardiovascular Cardiovascular examination reveals -normal heart sounds, regular rate and rhythm with no murmurs and no digital clubbing, cyanosis, edema, increased warmth or tenderness.  Abdomen Inspection Inspection of the abdomen reveals - No Hernias. Palpation/Percussion Palpation and Percussion of the abdomen reveal - Soft, Non Tender, No Rigidity (guarding), No hepatosplenomegaly and No Palpable abdominal masses.  Rectal Note: ext opening in right posterior ischio-anal space with tenderness to palpation consistent with fistula tract   Neurologic Neurologic evaluation reveals -alert and oriented x 3 with no impairment of recent or remote memory, normal attention span and ability to concentrate, normal  sensation and normal coordination.  Musculoskeletal Normal Exam - Bilateral-Upper Extremity Strength Normal and Lower Extremity Strength Normal.   ANOSCOPY, DIAGNOSTIC (98102) [ Hemorrhoids ] Procedure Other: Procedure: Anoscopy....Marland KitchenMarland KitchenSurgeon: Marcello Moores....Marland KitchenMarland KitchenAfter the risks and benefits were explained, verbal consent was obtained for above procedure. A medical assistant chaperone was present thoroughout the entire procedure. ....Marland KitchenMarland KitchenAnesthesia: none....Marland KitchenMarland KitchenDiagnosis: rectal bleeding....Marland KitchenMarland KitchenFindings: grade 2 RP hemorrhoid, blood noted in rectal vault above  Performed: 11/01/2018 4:56 PM    Assessment & Plan   ANAL FISTULA (K60.3) Impression: Impression: 48 year old male who presents to the office for evaluation of perianal drainage. He is status post I&D of a perirectal abscess in October 2019. He has had consistent drainage from the opening since then. On exam today he has a right posterior lateral external opening. The fistula probe easily inserts towards the anal canal. We discussed the treatment for this. Given that he has ulcerative colitis, I would like to take a more conservative approach and most likely place a seton. We discussed that we would only perform a fistulotomy if minimal sphincter complex was involved. We discussed that if I did perform a sphincterotomy he would most likely have worsening urgency and possible incontinence with loose stools and diarrhea.

## 2019-01-03 ENCOUNTER — Encounter (HOSPITAL_BASED_OUTPATIENT_CLINIC_OR_DEPARTMENT_OTHER): Payer: Self-pay | Admitting: General Surgery

## 2019-02-07 DIAGNOSIS — Z0000 Encounter for general adult medical examination without abnormal findings: Secondary | ICD-10-CM | POA: Diagnosis not present

## 2019-02-07 DIAGNOSIS — Z5181 Encounter for therapeutic drug level monitoring: Secondary | ICD-10-CM | POA: Diagnosis not present

## 2019-02-07 DIAGNOSIS — E785 Hyperlipidemia, unspecified: Secondary | ICD-10-CM | POA: Diagnosis not present

## 2019-02-07 DIAGNOSIS — K5100 Ulcerative (chronic) pancolitis without complications: Secondary | ICD-10-CM | POA: Diagnosis not present

## 2019-02-10 ENCOUNTER — Other Ambulatory Visit: Payer: Self-pay

## 2019-02-10 DIAGNOSIS — R6889 Other general symptoms and signs: Secondary | ICD-10-CM | POA: Diagnosis not present

## 2019-02-10 DIAGNOSIS — J069 Acute upper respiratory infection, unspecified: Secondary | ICD-10-CM | POA: Diagnosis not present

## 2019-02-10 DIAGNOSIS — Z20822 Contact with and (suspected) exposure to covid-19: Secondary | ICD-10-CM

## 2019-02-12 LAB — NOVEL CORONAVIRUS, NAA: SARS-CoV-2, NAA: NOT DETECTED

## 2019-03-22 DIAGNOSIS — H903 Sensorineural hearing loss, bilateral: Secondary | ICD-10-CM | POA: Diagnosis not present

## 2019-03-22 DIAGNOSIS — J343 Hypertrophy of nasal turbinates: Secondary | ICD-10-CM | POA: Diagnosis not present

## 2019-03-22 DIAGNOSIS — J342 Deviated nasal septum: Secondary | ICD-10-CM | POA: Diagnosis not present

## 2019-03-28 DIAGNOSIS — K51519 Left sided colitis with unspecified complications: Secondary | ICD-10-CM | POA: Diagnosis not present

## 2019-03-28 DIAGNOSIS — K603 Anal fistula: Secondary | ICD-10-CM | POA: Diagnosis not present

## 2019-05-11 DIAGNOSIS — Z01818 Encounter for other preprocedural examination: Secondary | ICD-10-CM | POA: Diagnosis not present

## 2019-05-19 DIAGNOSIS — J343 Hypertrophy of nasal turbinates: Secondary | ICD-10-CM | POA: Diagnosis not present

## 2019-05-19 DIAGNOSIS — J342 Deviated nasal septum: Secondary | ICD-10-CM | POA: Diagnosis not present

## 2019-07-26 DIAGNOSIS — U071 COVID-19: Secondary | ICD-10-CM | POA: Diagnosis not present

## 2019-07-27 DIAGNOSIS — R52 Pain, unspecified: Secondary | ICD-10-CM | POA: Diagnosis not present

## 2019-07-27 DIAGNOSIS — R509 Fever, unspecified: Secondary | ICD-10-CM | POA: Diagnosis not present

## 2019-08-12 DIAGNOSIS — K51519 Left sided colitis with unspecified complications: Secondary | ICD-10-CM | POA: Diagnosis not present

## 2019-09-09 DIAGNOSIS — K51519 Left sided colitis with unspecified complications: Secondary | ICD-10-CM | POA: Diagnosis not present

## 2019-11-11 IMAGING — CT CT ABD-PELV W/ CM
2 of 5 series · 13 of 46 positions shown, 15 images · IV contrast (iopamidol)
Comparison: None.

CLINICAL DATA: Rectal pain.

EXAM:
CT ABDOMEN AND PELVIS WITH CONTRAST
TECHNIQUE: Multidetector CT imaging of the abdomen and pelvis was performed
using the standard protocol following bolus administration of
intravenous contrast.
CONTRAST:  100mL [id number] IOPAMIDOL ([id number]) INJECTION 61%

[Series 2: abd pelvis 5.00 br40 s3 ax · axial · 0.57mm/px · z∈[+1039,+1479]mm · 10 of 103 slices shown, 12 images]
[im 10/103  soft-tissue]
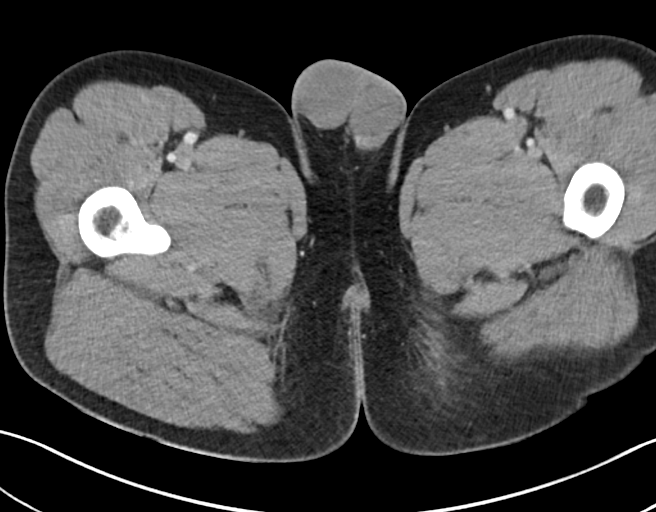
[im 10/103  bone]
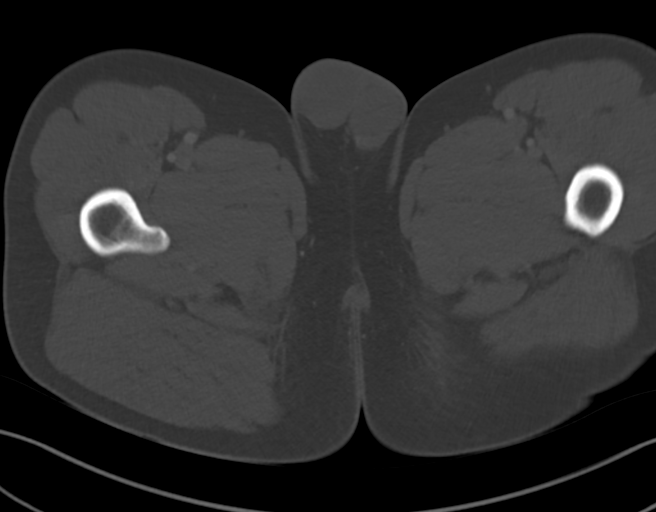
[im 20/103  soft-tissue]
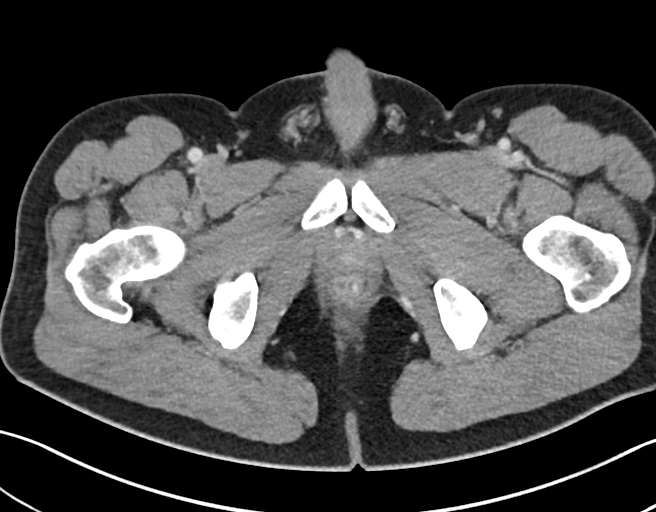
[im 30/103  soft-tissue]
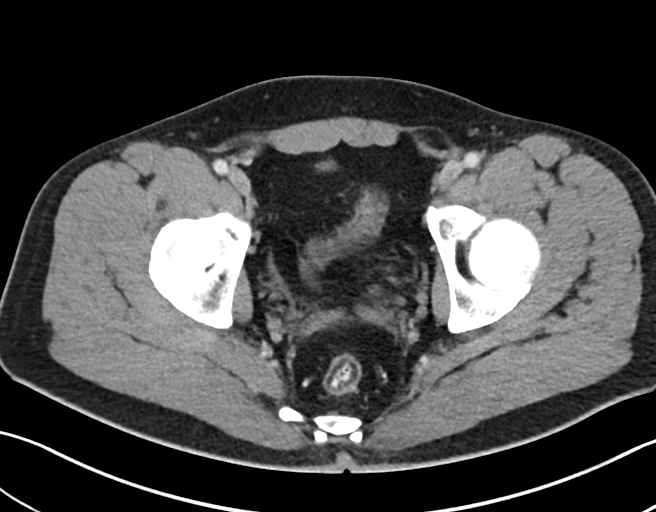
[im 39/103  soft-tissue]
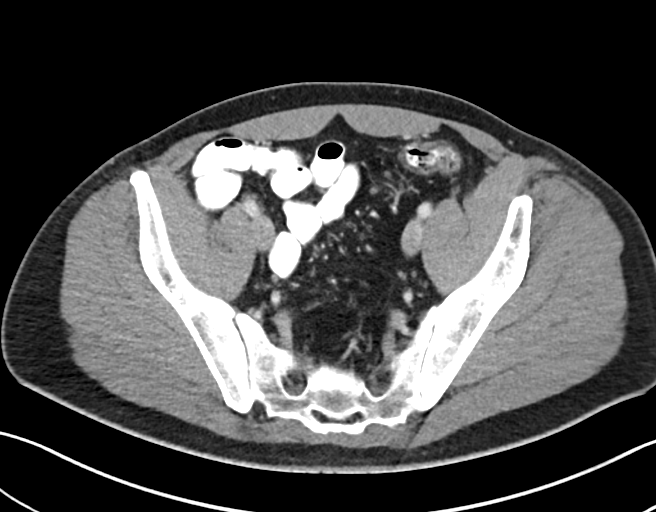
[im 49/103  soft-tissue]
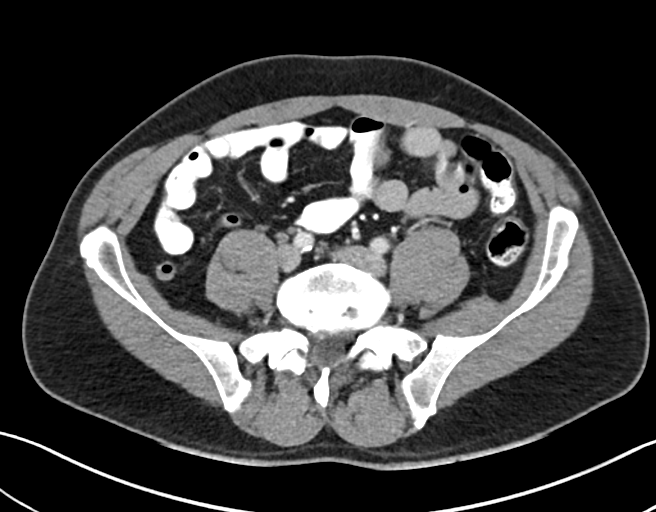
[im 59/103  soft-tissue]
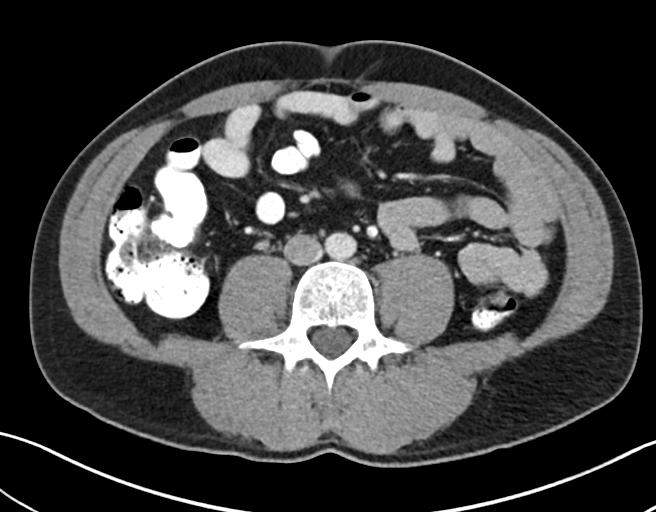
[im 69/103  soft-tissue]
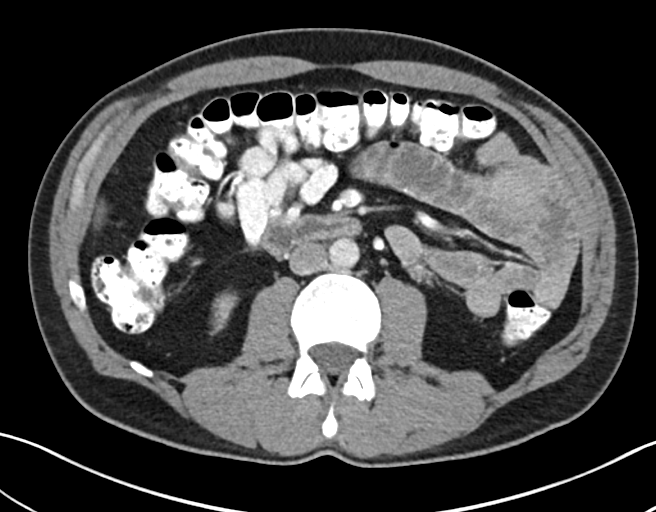
[im 78/103  soft-tissue]
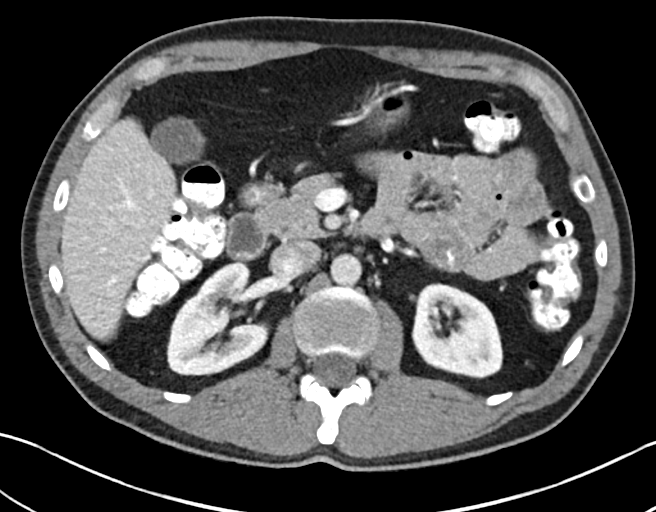
[im 88/103  soft-tissue]
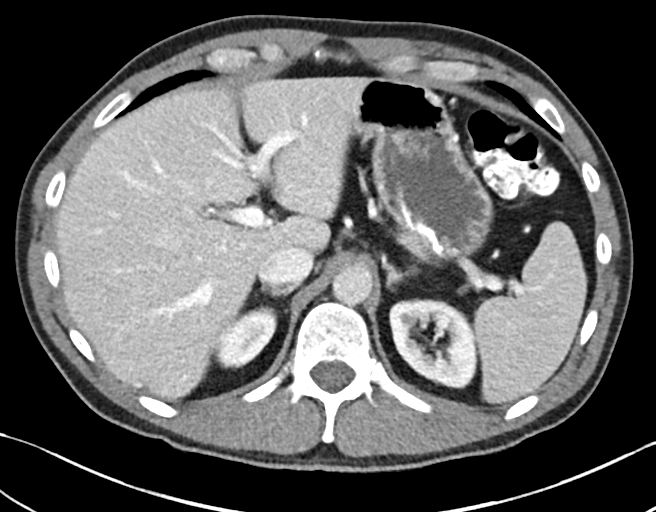
[im 88/103  bone]
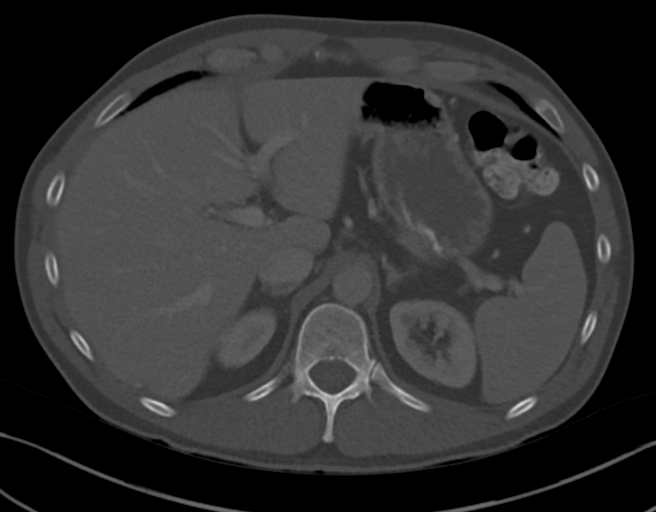
[im 98/103  soft-tissue]
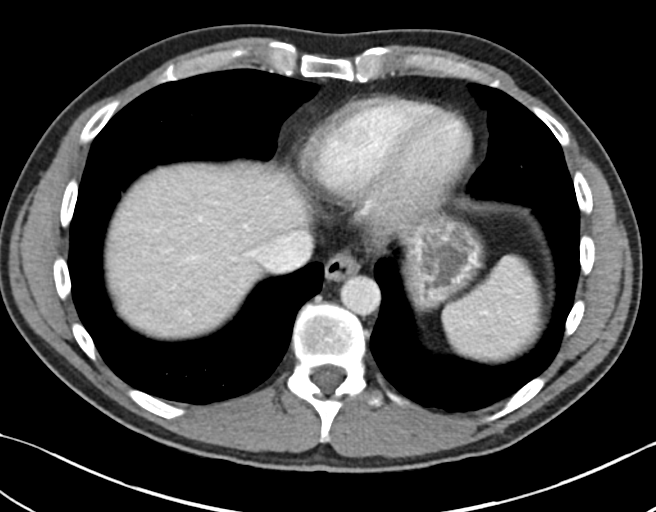

[Series 6: abd pelvis 2.00 br40 s3 cor · coronal · 0.73mm/px · 3 of 139 slices shown]
[im 47/139  soft-tissue]
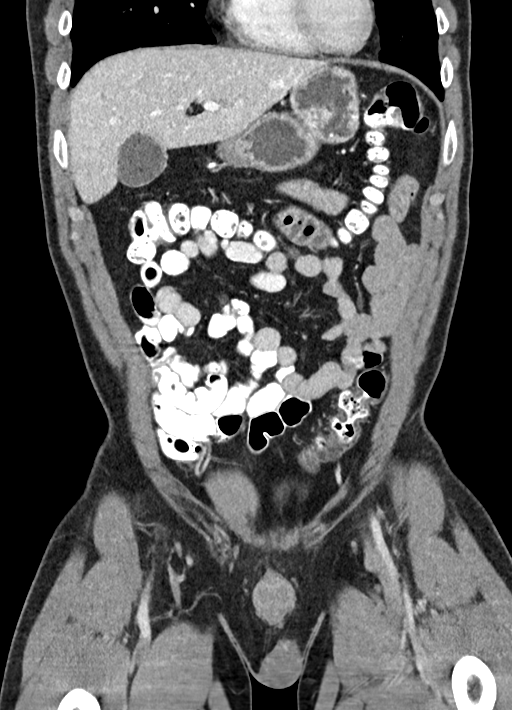
[im 62/139  soft-tissue]
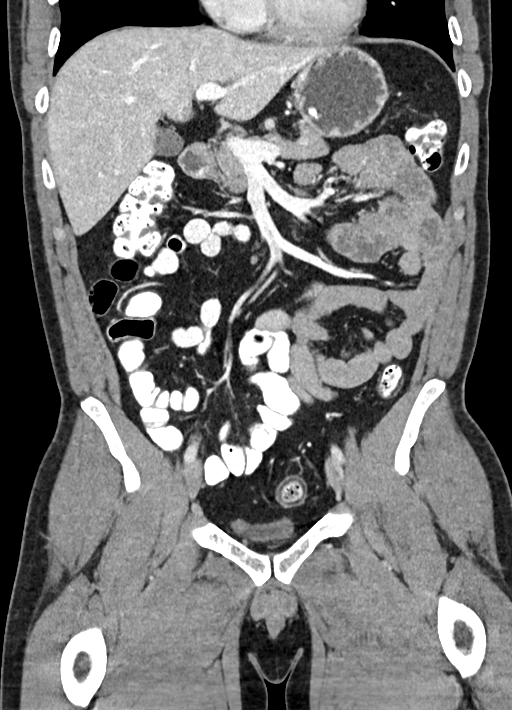
[im 77/139  soft-tissue]
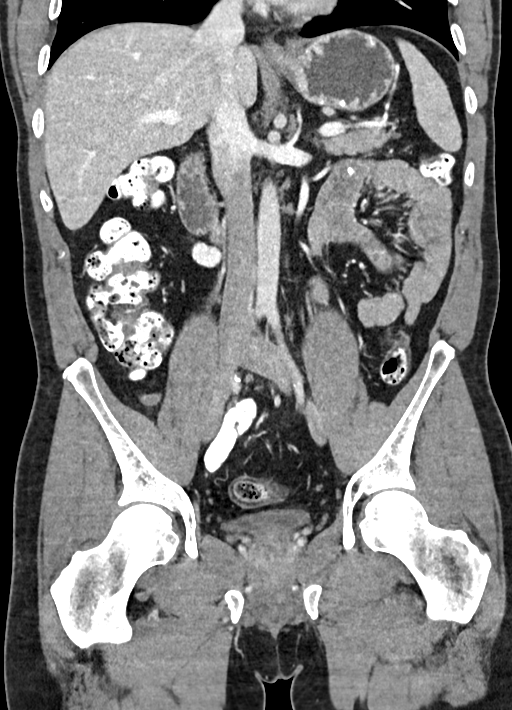

[13 of 46 positions shown; findings below may reference images not displayed]

FINDINGS: Lower chest: No acute abnormality.

Hepatobiliary: No focal liver abnormality is seen. No gallstones,
gallbladder wall thickening, or biliary dilatation.

Pancreas: Unremarkable. No pancreatic ductal dilatation or
surrounding inflammatory changes.

Spleen: Normal in size without focal abnormality.

Adrenals/Urinary Tract: Adrenal glands appear normal. Small right
renal cyst is noted. No hydronephrosis or renal obstruction is
noted. No renal or ureteral calculi are noted. Urinary bladder is
decompressed.

Stomach/Bowel: Stomach is within normal limits. Appendix appears
normal. No evidence of bowel wall thickening, distention, or
inflammatory changes.

Vascular/Lymphatic: No significant vascular findings are present. No
enlarged abdominal or pelvic lymph nodes.

Reproductive: Prostate is unremarkable.

Other: No abdominal wall hernia or abnormality. No abdominopelvic
ascites. Minimal stranding is seen in the soft tissues in the right
perianal region which may represent focal inflammation.

Musculoskeletal: No acute or significant osseous findings.
IMPRESSION: Minimal subcutaneous stranding is seen in the soft tissues in the
right perianal region which may represent focal inflammation. No
fluid collection or abscess is noted.

No other significant abnormality seen in the abdomen or pelvis.

## 2019-12-22 DIAGNOSIS — J343 Hypertrophy of nasal turbinates: Secondary | ICD-10-CM | POA: Diagnosis not present

## 2019-12-22 DIAGNOSIS — H0014 Chalazion left upper eyelid: Secondary | ICD-10-CM | POA: Diagnosis not present

## 2019-12-23 DIAGNOSIS — J3489 Other specified disorders of nose and nasal sinuses: Secondary | ICD-10-CM | POA: Diagnosis not present

## 2020-01-26 DIAGNOSIS — H0014 Chalazion left upper eyelid: Secondary | ICD-10-CM | POA: Diagnosis not present

## 2020-01-26 DIAGNOSIS — D485 Neoplasm of uncertain behavior of skin: Secondary | ICD-10-CM | POA: Diagnosis not present

## 2020-02-01 DIAGNOSIS — Z20828 Contact with and (suspected) exposure to other viral communicable diseases: Secondary | ICD-10-CM | POA: Diagnosis not present

## 2020-02-09 DIAGNOSIS — Z79899 Other long term (current) drug therapy: Secondary | ICD-10-CM | POA: Diagnosis not present

## 2020-02-09 DIAGNOSIS — E785 Hyperlipidemia, unspecified: Secondary | ICD-10-CM | POA: Diagnosis not present

## 2020-02-09 DIAGNOSIS — Z0000 Encounter for general adult medical examination without abnormal findings: Secondary | ICD-10-CM | POA: Diagnosis not present

## 2020-02-09 DIAGNOSIS — K5100 Ulcerative (chronic) pancolitis without complications: Secondary | ICD-10-CM | POA: Diagnosis not present

## 2020-02-22 DIAGNOSIS — K51519 Left sided colitis with unspecified complications: Secondary | ICD-10-CM | POA: Diagnosis not present

## 2020-02-23 DIAGNOSIS — H0011 Chalazion right upper eyelid: Secondary | ICD-10-CM | POA: Diagnosis not present

## 2020-02-23 DIAGNOSIS — L988 Other specified disorders of the skin and subcutaneous tissue: Secondary | ICD-10-CM | POA: Diagnosis not present

## 2020-03-07 DIAGNOSIS — K51519 Left sided colitis with unspecified complications: Secondary | ICD-10-CM | POA: Diagnosis not present

## 2020-04-05 DIAGNOSIS — H0014 Chalazion left upper eyelid: Secondary | ICD-10-CM | POA: Diagnosis not present

## 2020-04-05 DIAGNOSIS — H019 Unspecified inflammation of eyelid: Secondary | ICD-10-CM | POA: Diagnosis not present

## 2020-04-05 DIAGNOSIS — Z09 Encounter for follow-up examination after completed treatment for conditions other than malignant neoplasm: Secondary | ICD-10-CM | POA: Diagnosis not present

## 2020-04-16 DIAGNOSIS — K51519 Left sided colitis with unspecified complications: Secondary | ICD-10-CM | POA: Diagnosis not present

## 2020-09-12 DIAGNOSIS — N50819 Testicular pain, unspecified: Secondary | ICD-10-CM | POA: Diagnosis not present

## 2020-10-25 DIAGNOSIS — K602 Anal fissure, unspecified: Secondary | ICD-10-CM | POA: Diagnosis not present

## 2020-10-25 DIAGNOSIS — K51519 Left sided colitis with unspecified complications: Secondary | ICD-10-CM | POA: Diagnosis not present

## 2020-11-15 DIAGNOSIS — H903 Sensorineural hearing loss, bilateral: Secondary | ICD-10-CM | POA: Diagnosis not present

## 2020-11-15 DIAGNOSIS — K51519 Left sided colitis with unspecified complications: Secondary | ICD-10-CM | POA: Diagnosis not present

## 2020-11-19 DIAGNOSIS — F411 Generalized anxiety disorder: Secondary | ICD-10-CM | POA: Diagnosis not present

## 2020-11-20 DIAGNOSIS — K602 Anal fissure, unspecified: Secondary | ICD-10-CM | POA: Diagnosis not present

## 2020-11-20 DIAGNOSIS — K5150 Left sided colitis without complications: Secondary | ICD-10-CM | POA: Diagnosis not present

## 2020-12-03 DIAGNOSIS — F4323 Adjustment disorder with mixed anxiety and depressed mood: Secondary | ICD-10-CM | POA: Diagnosis not present

## 2021-01-02 DIAGNOSIS — K620 Anal polyp: Secondary | ICD-10-CM | POA: Diagnosis not present

## 2021-01-02 DIAGNOSIS — K5180 Other ulcerative colitis without complications: Secondary | ICD-10-CM | POA: Diagnosis not present

## 2021-01-25 DIAGNOSIS — M25511 Pain in right shoulder: Secondary | ICD-10-CM | POA: Diagnosis not present

## 2021-01-25 DIAGNOSIS — M25512 Pain in left shoulder: Secondary | ICD-10-CM | POA: Diagnosis not present

## 2021-02-04 DIAGNOSIS — B349 Viral infection, unspecified: Secondary | ICD-10-CM | POA: Diagnosis not present

## 2021-02-04 DIAGNOSIS — Z03818 Encounter for observation for suspected exposure to other biological agents ruled out: Secondary | ICD-10-CM | POA: Diagnosis not present

## 2021-02-04 DIAGNOSIS — R059 Cough, unspecified: Secondary | ICD-10-CM | POA: Diagnosis not present

## 2021-02-04 DIAGNOSIS — R509 Fever, unspecified: Secondary | ICD-10-CM | POA: Diagnosis not present

## 2021-02-04 DIAGNOSIS — J4520 Mild intermittent asthma, uncomplicated: Secondary | ICD-10-CM | POA: Diagnosis not present

## 2021-02-04 NOTE — Progress Notes (Signed)
Contacted patient to complete pre op phone call. Patient stated he is sick and has already contacted the surgeons office to reschedule.

## 2021-02-08 DIAGNOSIS — E538 Deficiency of other specified B group vitamins: Secondary | ICD-10-CM | POA: Diagnosis not present

## 2021-02-08 DIAGNOSIS — Z0000 Encounter for general adult medical examination without abnormal findings: Secondary | ICD-10-CM | POA: Diagnosis not present

## 2021-02-08 DIAGNOSIS — E785 Hyperlipidemia, unspecified: Secondary | ICD-10-CM | POA: Diagnosis not present

## 2021-02-08 DIAGNOSIS — Z79899 Other long term (current) drug therapy: Secondary | ICD-10-CM | POA: Diagnosis not present

## 2021-02-25 ENCOUNTER — Other Ambulatory Visit: Payer: Self-pay

## 2021-02-25 ENCOUNTER — Encounter (HOSPITAL_BASED_OUTPATIENT_CLINIC_OR_DEPARTMENT_OTHER): Payer: Self-pay | Admitting: Surgery

## 2021-02-25 NOTE — Progress Notes (Signed)
Spoke w/ via phone for pre-op interview---pt  Lab needs dos---- n/a               Lab results------n/a COVID test -----patient states asymptomatic no test needed Arrive at ------- 10 am on 02/28/2021 NPO after MN NO Solid Food.  Clear liquids from MN until---0900 02/28/2021 Med rec completed Medications to take morning of surgery -----n/a  Diabetic medication -----n/a Patient instructed no nail polish to be worn day of surgery Patient instructed to bring photo id and insurance card day of surgery Patient aware to have Driver (ride ) / wife Marc Mercado caregiver    for 24 hours after surgery  Patient Special Instructions -----n/a Pre-Op special Istructions -----n/a Patient verbalized understanding of instructions that were given at this phone interview. Patient denies shortness of breath, chest pain, fever, cough at this phone interview.     Pt would like wife to come in during phase 2 to go over d/c instructions

## 2021-02-27 ENCOUNTER — Ambulatory Visit: Payer: Self-pay | Admitting: Surgery

## 2021-02-28 ENCOUNTER — Ambulatory Visit (HOSPITAL_BASED_OUTPATIENT_CLINIC_OR_DEPARTMENT_OTHER): Payer: BC Managed Care – PPO | Admitting: Anesthesiology

## 2021-02-28 ENCOUNTER — Encounter (HOSPITAL_BASED_OUTPATIENT_CLINIC_OR_DEPARTMENT_OTHER): Payer: Self-pay | Admitting: Surgery

## 2021-02-28 ENCOUNTER — Other Ambulatory Visit: Payer: Self-pay

## 2021-02-28 ENCOUNTER — Ambulatory Visit (HOSPITAL_BASED_OUTPATIENT_CLINIC_OR_DEPARTMENT_OTHER)
Admission: RE | Admit: 2021-02-28 | Discharge: 2021-02-28 | Disposition: A | Discharge status: Home / Self Care | Payer: BC Managed Care – PPO | Attending: Surgery | Admitting: Surgery

## 2021-02-28 ENCOUNTER — Encounter (HOSPITAL_BASED_OUTPATIENT_CLINIC_OR_DEPARTMENT_OTHER)
Admission: RE | Disposition: A | Discharge status: Home / Self Care | Payer: Self-pay | Source: Home / Self Care | Attending: Surgery

## 2021-02-28 DIAGNOSIS — Z8616 Personal history of COVID-19: Secondary | ICD-10-CM | POA: Diagnosis not present

## 2021-02-28 DIAGNOSIS — D649 Anemia, unspecified: Secondary | ICD-10-CM | POA: Diagnosis not present

## 2021-02-28 DIAGNOSIS — E7800 Pure hypercholesterolemia, unspecified: Secondary | ICD-10-CM | POA: Diagnosis not present

## 2021-02-28 DIAGNOSIS — Z8719 Personal history of other diseases of the digestive system: Secondary | ICD-10-CM | POA: Diagnosis not present

## 2021-02-28 DIAGNOSIS — K620 Anal polyp: Secondary | ICD-10-CM | POA: Insufficient documentation

## 2021-02-28 DIAGNOSIS — Z87891 Personal history of nicotine dependence: Secondary | ICD-10-CM | POA: Diagnosis not present

## 2021-02-28 DIAGNOSIS — K219 Gastro-esophageal reflux disease without esophagitis: Secondary | ICD-10-CM | POA: Diagnosis not present

## 2021-02-28 HISTORY — DX: COVID-19: U07.1

## 2021-02-28 HISTORY — DX: Unspecified hearing loss, bilateral: H91.93

## 2021-02-28 HISTORY — PX: TRANSANAL EXCISION OF RECTAL MASS: SHX6134

## 2021-02-28 HISTORY — DX: Presence of spectacles and contact lenses: Z97.3

## 2021-02-28 HISTORY — PX: RECTAL EXAM UNDER ANESTHESIA: SHX6399

## 2021-02-28 SURGERY — EXCISION, MASS, RECTUM, ANAL APPROACH
Anesthesia: General | Site: Anus

## 2021-02-28 MED ORDER — ONDANSETRON HCL 4 MG/2ML IJ SOLN
INTRAMUSCULAR | Status: AC
  Filled 2021-02-28: qty 2

## 2021-02-28 MED ORDER — ACETAMINOPHEN 500 MG PO TABS
1000.0000 mg | ORAL_TABLET | ORAL | Status: AC
  Administered 2021-02-28: 1000 mg via ORAL

## 2021-02-28 MED ORDER — BUPIVACAINE LIPOSOME 1.3 % IJ SUSP: 20.0000 mL | Freq: Once | INTRAMUSCULAR | Status: DC

## 2021-02-28 MED ORDER — OXYCODONE HCL 5 MG PO TABS
5.0000 mg | ORAL_TABLET | Freq: Once | ORAL | Status: DC | PRN
Start: 2021-02-28 — End: 2021-02-28

## 2021-02-28 MED ORDER — MIDAZOLAM HCL 2 MG/2ML IJ SOLN
INTRAMUSCULAR | Status: AC
  Filled 2021-02-28: qty 2

## 2021-02-28 MED ORDER — FENTANYL CITRATE (PF) 100 MCG/2ML IJ SOLN
INTRAMUSCULAR | Status: DC | PRN
  Administered 2021-02-28 (×2): 50 ug via INTRAVENOUS

## 2021-02-28 MED ORDER — PROPOFOL 10 MG/ML IV BOLUS
INTRAVENOUS | Status: DC | PRN
  Administered 2021-02-28: 50 mg via INTRAVENOUS
  Administered 2021-02-28: 150 mg via INTRAVENOUS

## 2021-02-28 MED ORDER — PROPOFOL 10 MG/ML IV BOLUS
INTRAVENOUS | Status: AC
  Filled 2021-02-28: qty 40

## 2021-02-28 MED ORDER — DEXAMETHASONE SODIUM PHOSPHATE 10 MG/ML IJ SOLN
INTRAMUSCULAR | Status: DC | PRN
  Administered 2021-02-28: 8 mg via INTRAVENOUS

## 2021-02-28 MED ORDER — PROMETHAZINE HCL 25 MG/ML IJ SOLN: 6.2500 mg | INTRAMUSCULAR | Status: DC | PRN

## 2021-02-28 MED ORDER — BUPIVACAINE LIPOSOME 1.3 % IJ SUSP
INTRAMUSCULAR | Status: DC | PRN
  Administered 2021-02-28: 8 mL

## 2021-02-28 MED ORDER — 0.9 % SODIUM CHLORIDE (POUR BTL) OPTIME
TOPICAL | Status: DC | PRN
  Administered 2021-02-28: 500 mL

## 2021-02-28 MED ORDER — FENTANYL CITRATE (PF) 100 MCG/2ML IJ SOLN
INTRAMUSCULAR | Status: AC
  Filled 2021-02-28: qty 2

## 2021-02-28 MED ORDER — LIDOCAINE 2% (20 MG/ML) 5 ML SYRINGE
INTRAMUSCULAR | Status: DC | PRN
  Administered 2021-02-28: 80 mg via INTRAVENOUS

## 2021-02-28 MED ORDER — OXYCODONE HCL 5 MG/5ML PO SOLN: 5.0000 mg | Freq: Once | ORAL | Status: DC | PRN

## 2021-02-28 MED ORDER — ACETAMINOPHEN 500 MG PO TABS
ORAL_TABLET | ORAL | Status: AC
  Filled 2021-02-28: qty 2

## 2021-02-28 MED ORDER — MEPERIDINE HCL 25 MG/ML IJ SOLN: 6.2500 mg | INTRAMUSCULAR | Status: DC | PRN

## 2021-02-28 MED ORDER — TRAMADOL HCL 50 MG PO TABS: 50.0000 mg | ORAL_TABLET | Freq: Four times a day (QID) | ORAL | 0 refills | Status: AC | PRN

## 2021-02-28 MED ORDER — FLEET ENEMA 7-19 GM/118ML RE ENEM: 1.0000 | ENEMA | Freq: Once | RECTAL | Status: DC

## 2021-02-28 MED ORDER — DEXAMETHASONE SODIUM PHOSPHATE 10 MG/ML IJ SOLN
INTRAMUSCULAR | Status: AC
  Filled 2021-02-28: qty 1

## 2021-02-28 MED ORDER — ROCURONIUM BROMIDE 10 MG/ML (PF) SYRINGE
PREFILLED_SYRINGE | INTRAVENOUS | Status: AC
  Filled 2021-02-28: qty 10

## 2021-02-28 MED ORDER — LACTATED RINGERS IV SOLN: INTRAVENOUS | Status: DC

## 2021-02-28 MED ORDER — ONDANSETRON HCL 4 MG/2ML IJ SOLN
INTRAMUSCULAR | Status: DC | PRN
  Administered 2021-02-28: 4 mg via INTRAVENOUS

## 2021-02-28 MED ORDER — DIBUCAINE (PERIANAL) 1 % EX OINT
TOPICAL_OINTMENT | CUTANEOUS | Status: DC | PRN
  Administered 2021-02-28: 1 via RECTAL

## 2021-02-28 MED ORDER — BUPIVACAINE-EPINEPHRINE 0.25% -1:200000 IJ SOLN
INTRAMUSCULAR | Status: DC | PRN
  Administered 2021-02-28: 12 mL

## 2021-02-28 MED ORDER — HYDROMORPHONE HCL 1 MG/ML IJ SOLN: 0.2500 mg | INTRAMUSCULAR | Status: DC | PRN

## 2021-02-28 MED ORDER — SUGAMMADEX SODIUM 200 MG/2ML IV SOLN
INTRAVENOUS | Status: DC | PRN
  Administered 2021-02-28: 200 mg via INTRAVENOUS

## 2021-02-28 MED ORDER — MIDAZOLAM HCL 5 MG/5ML IJ SOLN
INTRAMUSCULAR | Status: DC | PRN
  Administered 2021-02-28: 2 mg via INTRAVENOUS

## 2021-02-28 MED ORDER — ROCURONIUM BROMIDE 10 MG/ML (PF) SYRINGE
PREFILLED_SYRINGE | INTRAVENOUS | Status: DC | PRN
  Administered 2021-02-28: 20 mg via INTRAVENOUS
  Administered 2021-02-28: 50 mg via INTRAVENOUS

## 2021-02-28 SURGICAL SUPPLY — 64 items
APL SKNCLS STERI-STRIP NONHPOA (GAUZE/BANDAGES/DRESSINGS) ×2
BENZOIN TINCTURE PRP APPL 2/3 (GAUZE/BANDAGES/DRESSINGS) ×4 IMPLANT
BLADE EXTENDED COATED 6.5IN (ELECTRODE) IMPLANT
BLADE SURG 10 STRL SS (BLADE) IMPLANT
BLADE SURG 15 STRL LF DISP TIS (BLADE) ×1 IMPLANT
BLADE SURG 15 STRL SS (BLADE) ×2
COVER BACK TABLE 60X90IN (DRAPES) ×2 IMPLANT
COVER MAYO STAND STRL (DRAPES) ×2 IMPLANT
DECANTER SPIKE VIAL GLASS SM (MISCELLANEOUS) ×2 IMPLANT
DRAPE HYSTEROSCOPY (MISCELLANEOUS) IMPLANT
DRAPE LAPAROTOMY 100X72 PEDS (DRAPES) ×2 IMPLANT
DRAPE SHEET LG 3/4 BI-LAMINATE (DRAPES) IMPLANT
DRAPE UTILITY XL STRL (DRAPES) ×2 IMPLANT
DRSG PAD ABDOMINAL 8X10 ST (GAUZE/BANDAGES/DRESSINGS) ×2 IMPLANT
GAUZE 4X4 16PLY ~~LOC~~+RFID DBL (SPONGE) ×2 IMPLANT
GAUZE SPONGE 4X4 12PLY STRL (GAUZE/BANDAGES/DRESSINGS) ×2 IMPLANT
GAUZE SPONGE 4X4 12PLY STRL LF (GAUZE/BANDAGES/DRESSINGS) ×2 IMPLANT
GLOVE SRG 8 PF TXTR STRL LF DI (GLOVE) ×1 IMPLANT
GLOVE SURG ENC MOIS LTX SZ7.5 (GLOVE) ×2 IMPLANT
GLOVE SURG UNDER POLY LF SZ8 (GLOVE) ×2
GOWN STRL REUS W/TWL LRG LVL3 (GOWN DISPOSABLE) ×2 IMPLANT
HYDROGEN PEROXIDE 16OZ (MISCELLANEOUS) IMPLANT
IV CATH 14GX2 1/4 (CATHETERS) IMPLANT
IV CATH 18G SAFETY (IV SOLUTION) IMPLANT
KIT SIGMOIDOSCOPE (SET/KITS/TRAYS/PACK) IMPLANT
KIT TURNOVER CYSTO (KITS) ×2 IMPLANT
LEGGING LITHOTOMY PAIR STRL (DRAPES) IMPLANT
LOOP VESSEL MAXI BLUE (MISCELLANEOUS) IMPLANT
NDL SAFETY ECLIPSE 18X1.5 (NEEDLE) IMPLANT
NEEDLE HYPO 18GX1.5 SHARP (NEEDLE)
NEEDLE HYPO 22GX1.5 SAFETY (NEEDLE) ×2 IMPLANT
NEEDLE HYPO 25X1 1.5 SAFETY (NEEDLE) IMPLANT
NS IRRIG 500ML POUR BTL (IV SOLUTION) ×2 IMPLANT
PACK BASIN DAY SURGERY FS (CUSTOM PROCEDURE TRAY) ×2 IMPLANT
PANTS MESH DISP LRG (UNDERPADS AND DIAPERS) ×1 IMPLANT
PANTS MESH DISPOSABLE L (UNDERPADS AND DIAPERS) ×1
PENCIL SMOKE EVACUATOR (MISCELLANEOUS) ×2 IMPLANT
SPONGE HEMORRHOID 8X3CM (HEMOSTASIS) IMPLANT
SPONGE SURGIFOAM ABS GEL 12-7 (HEMOSTASIS) IMPLANT
SUCTION FRAZIER HANDLE 10FR (MISCELLANEOUS)
SUCTION TUBE FRAZIER 10FR DISP (MISCELLANEOUS) IMPLANT
SUT CHROMIC 2 0 SH (SUTURE) IMPLANT
SUT CHROMIC 3 0 SH 27 (SUTURE) IMPLANT
SUT MNCRL AB 4-0 PS2 18 (SUTURE) IMPLANT
SUT SILK 0 PSL NDL (SUTURE) IMPLANT
SUT SILK 0 TIES 10X30 (SUTURE) IMPLANT
SUT SILK 2 0 (SUTURE)
SUT SILK 2-0 18XBRD TIE 12 (SUTURE) IMPLANT
SUT VIC AB 2-0 SH 27 (SUTURE)
SUT VIC AB 2-0 SH 27XBRD (SUTURE) IMPLANT
SUT VIC AB 3-0 SH 18 (SUTURE) IMPLANT
SUT VIC AB 3-0 SH 27 (SUTURE) ×2
SUT VIC AB 3-0 SH 27X BRD (SUTURE) ×1 IMPLANT
SUT VIC AB 3-0 SH 27XBRD (SUTURE) IMPLANT
SUT VIC AB 4-0 P-3 18XBRD (SUTURE) IMPLANT
SUT VIC AB 4-0 P3 18 (SUTURE)
SYR 20ML LL LF (SYRINGE) IMPLANT
SYR BULB IRRIG 60ML STRL (SYRINGE) ×2 IMPLANT
SYR CONTROL 10ML LL (SYRINGE) ×2 IMPLANT
SYR TB 1ML LL NO SAFETY (SYRINGE) IMPLANT
TOWEL OR 17X26 10 PK STRL BLUE (TOWEL DISPOSABLE) ×2 IMPLANT
TRAY DSU PREP LF (CUSTOM PROCEDURE TRAY) ×2 IMPLANT
TUBE CONNECTING 12X1/4 (SUCTIONS) ×2 IMPLANT
YANKAUER SUCT BULB TIP NO VENT (SUCTIONS) ×2 IMPLANT

## 2021-02-28 NOTE — Discharge Instructions (Addendum)
ANORECTAL SURGERY: POST OP INSTRUCTIONS  DIET: Follow a light bland diet the first 24 hours after arrival home, such as soup, liquids, crackers, etc.  Be sure to include lots of fluids daily.  Avoid fast food or heavy meals as your are more likely to get nauseated.  Eat a low fat diet the next few days after surgery.   Some bleeding with bowel movements is expected for the first couple of days but this should stop in between bowel movements  Take your usually prescribed home medications unless otherwise directed.  PAIN CONTROL: It is helpful to take an over-the-counter pain medication regularly for the first few days/weeks.  Choose from the following that works best for you: Ibuprofen (Advil, etc) Three 245m tabs every 6 hours as needed. Acetaminophen (Tylenol, etc) 500-6574mevery 6 hours as needed. May take again at 4:45pm if needed. NOTE: You may take both of these medications together - most patients find it most helpful when alternating between the two (i.e. Ibuprofen at 6am, tylenol at 9am, ibuprofen at 12pm ...)Marland KitchenA  prescription for pain medication may have been prescribed for you at discharge.  Take your pain medication as prescribed.  If you are having problems/concerns with the prescription medicine, please call usKoreaor further advice.  Avoid getting constipated.  Between the surgery and the pain medications, it is common to experience some constipation.  Increasing fluid intake (64oz of water per day) and taking a fiber supplement (such as Metamucil, Citrucel, FiberCon) 1-2 times a day regularly will usually help prevent this problem from occurring.  Take Miralax (over the counter) 1-2x/day while taking a narcotic pain medication. If no bowel movement after 48hours, you may additionally take a laxative like a bottle of Milk of Magnesia which can be purchased over the counter. Avoid enemas if possible as these are often painful.   Watch out for diarrhea.  If you have many loose bowel  movements, simplify your diet to bland foods.  Stop any stool softeners and decrease your fiber supplement. If this worsens or does not improve, please call usKorea Wash / shower every day.  If you were discharged with a dressing, you may remove this the day after your surgery. You may shower normally, getting soap/water on your wound, particularly after bowel movements.  Soaking in a warm bath filled a couple inches ("Sitz bath") is a great way to clean the area after a bowel movement and many patients find it is a way to soothe the area.  ACTIVITIES as tolerated:   You may resume regular (light) daily activities beginning the next day--such as daily self-care, walking, climbing stairs--gradually increasing activities as tolerated.  If you can walk 30 minutes without difficulty, it is safe to try more intense activity such as jogging, treadmill, bicycling, low-impact aerobics, etc. Refrain from any heavy lifting or straining for the first 2 weeks after your procedure, particularly if your surgery was for hemorrhoids. Avoid activities that make your pain worse You may drive when you are no longer taking prescription pain medication, you can comfortably wear a seatbelt, and you can safely maneuver your car and apply brakes.  FOLLOW UP in our office Please call [facility] at [phone] to set up an appointment to see your surgeon in the office for a follow-up appointment approximately 2 weeks after your surgery. Make sure that you call for this appointment the day you arrive home to insure a convenient appointment time.  9. If you have disability or family  leave forms that need to be completed, you may have them completed by your primary care physician's office; for return to work instructions, please ask our office staff and they will be happy to assist you in obtaining this documentation   When to call us [phone]: Poor pain control Reactions / problems with new medications (rash/itching, etc)   Fever over 101.5 F (38.5 C) Inability to urinate Nausea/vomiting Worsening swelling or bruising Continued bleeding from incision. Increased pain, redness, or drainage from the incision  The clinic staff is available to answer your questions during regular business hours (8:30am-5pm).  Please don't hesitate to call and ask to speak to one of our nurses for clinical concerns.   A surgeon from [facility] is always on call at the hospitals   If you have a medical emergency, go to the nearest emergency room or call 911.   [facility] [address], [place], [place], [place]  [address] MAIN: [phone] FAX: [phone] [email or web address]   Post Anesthesia Home Care Instructions  Activity: Get plenty of rest for the remainder of the day. A responsible individual must stay with you for 24 hours following the procedure.  For the next 24 hours, DO NOT: -Drive a car -Paediatric nurse -Drink alcoholic beverages -Take any medication unless instructed by your physician -Make any legal decisions or sign important papers.  Meals: Start with liquid foods such as gelatin or soup. Progress to regular foods as tolerated. Avoid greasy, spicy, heavy foods. If nausea and/or vomiting occur, drink only clear liquids until the nausea and/or vomiting subsides. Call your physician if vomiting continues.  Special Instructions/Symptoms: Your throat may feel dry or sore from the anesthesia or the breathing tube placed in your throat during surgery. If this causes discomfort, gargle with warm salt water. The discomfort should disappear within 24 hours.  Information for Discharge Teaching: EXPAREL (bupivacaine liposome injectable suspension)   Your surgeon or anesthesiologist gave you EXPAREL(bupivacaine) to help control your pain after surgery.  EXPAREL is a local anesthetic that provides pain relief by numbing the tissue around  the surgical site. EXPAREL is designed to release pain medication over time and can control pain for up to 72 hours. Depending on how you respond to EXPAREL, you may require less pain medication during your recovery.  Possible side effects: Temporary loss of sensation or ability to move in the area where bupivacaine was injected. Nausea, vomiting, constipation Rarely, numbness and tingling in your mouth or lips, lightheadedness, or anxiety may occur. Call your doctor right away if you think you may be experiencing any of these sensations, or if you have other questions regarding possible side effects.  Follow all other discharge instructions given to you by your surgeon or nurse. Eat a healthy diet and drink plenty of water or other fluids.  If you return to the hospital for any reason within 96 hours following the administration of EXPAREL, it is important for health care providers to know that you have received this anesthetic. A teal colored band has been placed on your arm with the date, time and amount of EXPAREL you have received in order to alert and inform your health care providers. Please leave this armband in place for the full 96 hours following administration, and then you may remove the band.

## 2021-02-28 NOTE — Anesthesia Preprocedure Evaluation (Addendum)
Anesthesia Evaluation  Patient identified by MRN, date of birth, ID band Patient awake    Reviewed: Allergy & Precautions, NPO status , Patient's Chart, lab work & pertinent test results  Airway Mallampati: II  TM Distance: >3 FB     Dental  (+) Dental Advisory Given   Pulmonary former smoker,    breath sounds clear to auscultation       Cardiovascular negative cardio ROS   Rhythm:Regular Rate:Normal     Neuro/Psych  Headaches,    GI/Hepatic Neg liver ROS, PUD, GERD  ,  Endo/Other    Renal/GU negative Renal ROS     Musculoskeletal   Abdominal   Peds  Hematology  (+) anemia ,   Anesthesia Other Findings   Reproductive/Obstetrics                             Anesthesia Physical  Anesthesia Plan  ASA: 2  Anesthesia Plan: General   Post-op Pain Management:    Induction: Intravenous  PONV Risk Score and Plan: 3 and Ondansetron, Dexamethasone, Midazolam and Treatment may vary due to age or medical condition  Airway Management Planned: Oral ETT  Additional Equipment: None  Intra-op Plan:   Post-operative Plan: Extubation in OR  Informed Consent: I have reviewed the patients History and Physical, chart, labs and discussed the procedure including the risks, benefits and alternatives for the proposed anesthesia with the patient or authorized representative who has indicated his/her understanding and acceptance.     Dental advisory given  Plan Discussed with: CRNA  Anesthesia Plan Comments:         Anesthesia Quick Evaluation

## 2021-02-28 NOTE — Op Note (Signed)
02/28/2021  1:47 PM  PATIENT:  Marc Mercado  50 y.o. male  Patient Care Team: Jonathon Jordan, MD as PCP - General (Family Medicine)  PRE-OPERATIVE DIAGNOSIS:  Anal canal polypoid lesion  POST-OPERATIVE DIAGNOSIS:  Left posterior anal canal polypoid lesion  PROCEDURE:   Trans-anal excision of anal canal polypoid lesion ((1.5 x 1.5 cm) using anoscopy Anorectal exam under anesthesia  SURGEON:  Surgeon(s): Ileana Roup, MD  ANESTHESIA:   local and general  SPECIMEN:  Left posterior anal canal polypoid lesion  DISPOSITION OF SPECIMEN:  PATHOLOGY  COUNTS:  Sponge, needle, and instrument counts were reported correct x2 at conclusion.  EBL: 3 mL  Drains: None  PLAN OF CARE: Discharge to home after PACU  PATIENT DISPOSITION:  PACU - hemodynamically stable.  OR FINDINGS: Left posterior anal canal polypoid lesion on the lateral aspect of his prior apparent fistulotomy.  This is soft.  This is somewhat friable.  This is fully excised under the use of a anoscopy and submitted for pathology.  Keyhole type deformity consistent with his prior fistulotomy.  DESCRIPTION: The patient was identified in the preoperative holding area and taken to the OR. SCDs were applied. He then underwent general endotracheal anesthesia without difficulty. The patient was then rolled onto the OR table in the prone jackknife position. Pressure points were then evaluated and padded. Benzoin was applied to the buttocks and they were gently taped apart.  He was then prepped and draped in usual sterile fashion.  A surgical timeout was performed indicating the correct patient, procedure, and positioning.  A perianal block was then created using a dilute mixture of 0.25% Marcaine with epinephrine and Exparel.  After ascertaining an appropriate level of anesthesia had been achieved, a well lubricated digital rectal exam was performed. This demonstrated no clearly palpable masses.  A Hill-Ferguson anoscope was  into the anal canal and circumferential inspection demonstrated prior fistulotomy scar in the posterior midline with a mild keyhole type deformity at that location.  The scar is normal/healthy in appearance.  On the lateral border of this in the left posterior position, there is a apparent polypoid lesion that is 1.5 x 1.5 cm.  This is friable and bleeds on contact.  This is soft and mobile.  This is only able to be visualized under anoscopy.  The remainder of the anal canal is normal in appearance.  Preparations were made for resection of this polypoid lesion.  This is elevated with a DeBakey forcep under endoscopic guidance.  Borders of the lesion are marked such that there is a healthy rim of normal-appearing tissue around it.  This is then fully excised sharply using cutting current.  This is passed off as specimen.  Hemostasis is then achieved with electrocautery.  The anal canal is irrigated and hemostasis verified.  The defect is then approximated using 3-0 Vicryl interrupted sutures.  Additional local anesthetic is infiltrated.  All sponge, needle, and instrument counts are reported correct.  Topical Dibucaine is applied.  A dressing consisting of 4 x 4's, ABD, and mesh underwear was placed.  He is taken out of the prone position, rolled onto a stretcher, awakened from anesthesia, and transferred to PACU in satisfactory condition.  DISPOSITION: PACU in satisfactory condition.

## 2021-02-28 NOTE — Transfer of Care (Signed)
Immediate Anesthesia Transfer of Care Note  Patient: Marc Mercado  Procedure(s) Performed: Procedure(s) (LRB): EXCISION OF ANAL POLYP (N/A) ANORECTAL EXAM UNDER ANESTHESIA (N/A)  Patient Location: PACU  Anesthesia Type: General  Level of Consciousness: awake, sedated, patient cooperative and responds to stimulation  Airway & Oxygen Therapy: Patient Spontanous Breathing and Patient connected to [place] 02 and soft FM   Post-op Assessment: Report given to PACU RN, Post -op Vital signs reviewed and stable and Patient moving all extremities  Post vital signs: Reviewed and stable  Complications: No apparent anesthesia complications

## 2021-02-28 NOTE — Anesthesia Procedure Notes (Signed)
Procedure Name: Intubation Date/Time: 02/28/2021 12:44 PM Performed by: Georgeanne Nim, CRNA Pre-anesthesia Checklist: Patient identified, Emergency Drugs available, Suction available, Patient being monitored and Timeout performed Patient Re-evaluated:Patient Re-evaluated prior to induction Oxygen Delivery Method: Circle system utilized Preoxygenation: Pre-oxygenation with 100% oxygen Induction Type: IV induction Ventilation: Mask ventilation without difficulty Laryngoscope Size: Mac and 4 Grade View: Grade I Tube size: 7.5 mm Number of attempts: 1 Airway Equipment and Method: Stylet Placement Confirmation: ETT inserted through vocal cords under direct vision, positive ETCO2, CO2 detector and breath sounds checked- equal and bilateral Secured at: 23 cm Tube secured with: Tape Dental Injury: Teeth and Oropharynx as per pre-operative assessment

## 2021-02-28 NOTE — H&P (Signed)
CC: here today for surgery  HPI: Marc Mercado is an 50 y.o. male history 'UC' (on azathioprine), hemorrhoids (s/p hemorrhoidectomies and injections), anal fistula (s/p fistulotomy) whom is seen today as an office consultation at the request of Dr. Oletta Lamas for evaluation of some mucousy type drainage that began approximately 3 months ago. He has had some associated prolapsing tissue with this. He denies any drainage of stool. He denies any recurring problems with regards to an abscess. He has had a known small keyhole deformity of the anal area related to his fistulotomy in 2020.   He denies any changes in his health or health history since we met in the office.  Still having some issues with regards to chronic moisture in the perianal area and mucousy type drainage.  PMH: ?UC, hemorrhoids, anal fistula  PSH: As above  FHx: Denies any known family history of colorectal, breast, endometrial or ovarian cancer  Social Hx: Denies use of tobacco/etoh/drug use  Past Medical History:  Diagnosis Date   Anal fistula    Anemia    Blood transfusion AGE 28   Chest pressure    COVID    feb 2020 felt bad all over, fever, fatigue,sore throat, nasl congestion. H/a, coughing for about 2 weeks.  lost taste and smell for 2 months.   COVID    Nov 202 fever, sinus congestion, fatigue for 2 days   Generalized headaches    GERD (gastroesophageal reflux disease)    Hearing deficit, bilateral    wears hearing aides in both ears. 02/25/2021   Hemorrhoids    Hernia, inguinal, right    History of stomach ulcers    Hypercholesterolemia    Migraine    vascular migraines doesnt take anything for them. occur a couple times a year. 02/25/2021   Perirectal abscess    Ulcerative colitis ([place])    Wears glasses    02/25/2021    Past Surgical History:  Procedure Laterality Date   EVALUATION UNDER ANESTHESIA WITH ANAL FISTULECTOMY N/A 12/31/2018   Procedure: ANAL EXAM UNDER ANESTHESIA WITH ANAL  FISTULOTOMY;  Surgeon: Leighton Ruff, MD;  Location: [facility];  Service: General;  Laterality: N/A;   [place]  2010 and 2012   INCISION AND DRAINAGE ABSCESS N/A 04/16/2018   Procedure: INCISION AND DRAINAGE OF PERIRECTAL ABSCESS;  Surgeon: Leighton Ruff, MD;  Location: [facility];  Service: General;  Laterality: N/A;   [place]   GLAND REMOVED FROM BOTTOM LIP ON RIGHT   SPHINCTETOTOMY     SPINE SURGERY  2006   L5   SURGERY FOR FOOT FRACTURE Left 2019   WRIST SURGERY Left 2018   CYST REMOVED    Family History  Problem Relation Age of Onset   Cancer Mother        breast Marc Mercado   Cancer Father        skin   Colon polyps Father     Social:  reports that he quit smoking about 22 years ago. His smoking use included cigarettes. He has a 15.00 pack-year smoking history. He has never used smokeless tobacco. He reports that he does not currently use alcohol. He reports that he does not use drugs.  Allergies:  Allergies  Allergen Reactions   Celebrex [Celecoxib] Other (See Comments)    Makes his ulcerative colitis worse   Nsaids     DUE TO ULCERATIVE COALITIS   Levaquin [Levofloxacin] Itching and Rash  Medications: I have reviewed the patient's current medications.  No results found for this or any previous visit (from the past 48 hour(s)).  No results found.  ROS - all of the below systems have been reviewed with the patient and positives are indicated with bold text General: chills, fever or night sweats Eyes: blurry vision or double vision ENT: epistaxis or sore throat Allergy/Immunology: itchy/watery eyes or nasal congestion Hematologic/Lymphatic: bleeding problems, blood clots or swollen lymph nodes Endocrine: temperature intolerance or unexpected weight changes Breast: new or changing breast lumps or nipple discharge Resp: cough, shortness of breath, or wheezing CV: chest pain or dyspnea on exertion GI: as per  HPI GU: dysuria, trouble voiding, or hematuria MSK: joint pain or joint stiffness Neuro: TIA or stroke symptoms Derm: pruritus and skin lesion changes Psych: anxiety and depression  PE Blood pressure 121/90, pulse 70, temperature 98.4 F (36.9 C), temperature source Oral, resp. rate 16, height 6' (1.829 m), weight 73.1 kg, SpO2 100 %. Constitutional: NAD; conversant; wearing mask Eyes: Moist conjunctiva; no lid lag; anicteric Lungs: Normal respiratory effort CV: RRR; no pitting edema Psychiatric: Appropriate affect; alert and oriented x3  No results found for this or any previous visit (from the past 48 hour(s)).  No results found.   A/P: [name] is a very pleasant 50 year old male with history of presumed UC, hemorrhoids, anal fistula (status post fistulotomy, 2020 by my partner Dr. Marcello Moores), here for surgery for an apparent anal polyp that appears to be prolapsing and may be the cause of some of the mucus he has seen  -The anatomy and physiology of the anal canal was discussed with the patient. The pathophysiology of anal polyps and hemorrhoids was discussed as well. -We have reviewed options going forward including further observation vs surgery - excision of anal canal polyp; anorectal exam under anesthesia -The planned procedure, material risks (including, but not limited to, pain, bleeding, infection, scarring, need for blood transfusion, damage to anal sphincter, incontinence of gas and/or stool, need for additional procedures, recurrence, pneumonia, heart attack, stroke, death) benefits and alternatives to surgery were discussed at length. The patient's questions were answered to his satisfaction, he voiced understanding and elected to proceed with surgery. Additionally, we discussed typical postoperative expectations and the recovery process.  Nadeen Landau, MD [facility] Use [email or web address] to contact on call provider

## 2021-03-01 ENCOUNTER — Encounter (HOSPITAL_BASED_OUTPATIENT_CLINIC_OR_DEPARTMENT_OTHER): Payer: Self-pay | Admitting: Surgery

## 2021-03-01 LAB — SURGICAL PATHOLOGY

## 2021-03-01 NOTE — Anesthesia Postprocedure Evaluation (Signed)
Anesthesia Post Note  Patient: Marc Mercado  Procedure(s) Performed: EXCISION OF ANAL POLYP (Anus) ANORECTAL EXAM UNDER ANESTHESIA (Anus)     Patient location during evaluation: PACU Anesthesia Type: General Level of consciousness: sedated and patient cooperative Pain management: pain level controlled Vital Signs Assessment: post-procedure vital signs reviewed and stable Respiratory status: spontaneous breathing Cardiovascular status: stable Anesthetic complications: no   No notable events documented.  Last Vitals:  Vitals:   02/28/21 1415 02/28/21 1510  BP: 116/86 122/80  Pulse: 83 60  Resp: 10 14  Temp: (!) 36.4 C 36.6 C  SpO2: 100% 100%    Last Pain:  Vitals:   02/28/21 1510  TempSrc:   PainSc: 0-No pain                 Nolon Nations

## 2021-08-14 DIAGNOSIS — F4389 Other reactions to severe stress: Secondary | ICD-10-CM | POA: Diagnosis not present

## 2021-08-22 DIAGNOSIS — F4389 Other reactions to severe stress: Secondary | ICD-10-CM | POA: Diagnosis not present

## 2021-08-28 DIAGNOSIS — F4389 Other reactions to severe stress: Secondary | ICD-10-CM | POA: Diagnosis not present

## 2021-09-04 DIAGNOSIS — F4389 Other reactions to severe stress: Secondary | ICD-10-CM | POA: Diagnosis not present

## 2021-09-11 DIAGNOSIS — F4389 Other reactions to severe stress: Secondary | ICD-10-CM | POA: Diagnosis not present

## 2021-09-30 DIAGNOSIS — F4389 Other reactions to severe stress: Secondary | ICD-10-CM | POA: Diagnosis not present

## 2021-10-21 DIAGNOSIS — H18831 Recurrent erosion of cornea, right eye: Secondary | ICD-10-CM | POA: Diagnosis not present

## 2021-12-20 DIAGNOSIS — Z125 Encounter for screening for malignant neoplasm of prostate: Secondary | ICD-10-CM | POA: Diagnosis not present

## 2021-12-20 DIAGNOSIS — E785 Hyperlipidemia, unspecified: Secondary | ICD-10-CM | POA: Diagnosis not present

## 2021-12-20 DIAGNOSIS — Z0000 Encounter for general adult medical examination without abnormal findings: Secondary | ICD-10-CM | POA: Diagnosis not present

## 2021-12-20 DIAGNOSIS — Z79899 Other long term (current) drug therapy: Secondary | ICD-10-CM | POA: Diagnosis not present

## 2021-12-20 DIAGNOSIS — K51519 Left sided colitis with unspecified complications: Secondary | ICD-10-CM | POA: Diagnosis not present

## 2022-01-17 DIAGNOSIS — M25511 Pain in right shoulder: Secondary | ICD-10-CM | POA: Diagnosis not present

## 2022-01-17 DIAGNOSIS — M79672 Pain in left foot: Secondary | ICD-10-CM | POA: Diagnosis not present

## 2022-08-18 DIAGNOSIS — M25511 Pain in right shoulder: Secondary | ICD-10-CM | POA: Diagnosis not present

## 2022-08-18 DIAGNOSIS — M25512 Pain in left shoulder: Secondary | ICD-10-CM | POA: Diagnosis not present

## 2022-09-08 DIAGNOSIS — M25512 Pain in left shoulder: Secondary | ICD-10-CM | POA: Diagnosis not present

## 2022-09-08 DIAGNOSIS — M542 Cervicalgia: Secondary | ICD-10-CM | POA: Diagnosis not present

## 2022-09-08 DIAGNOSIS — M25511 Pain in right shoulder: Secondary | ICD-10-CM | POA: Diagnosis not present

## 2022-09-18 DIAGNOSIS — R509 Fever, unspecified: Secondary | ICD-10-CM | POA: Diagnosis not present

## 2022-09-19 DIAGNOSIS — M791 Myalgia, unspecified site: Secondary | ICD-10-CM | POA: Diagnosis not present

## 2022-09-19 DIAGNOSIS — R509 Fever, unspecified: Secondary | ICD-10-CM | POA: Diagnosis not present

## 2022-09-19 DIAGNOSIS — J029 Acute pharyngitis, unspecified: Secondary | ICD-10-CM | POA: Diagnosis not present

## 2022-09-19 DIAGNOSIS — Z03818 Encounter for observation for suspected exposure to other biological agents ruled out: Secondary | ICD-10-CM | POA: Diagnosis not present

## 2022-09-19 DIAGNOSIS — R051 Acute cough: Secondary | ICD-10-CM | POA: Diagnosis not present

## 2022-09-26 DIAGNOSIS — J189 Pneumonia, unspecified organism: Secondary | ICD-10-CM | POA: Diagnosis not present

## 2022-10-02 DIAGNOSIS — J189 Pneumonia, unspecified organism: Secondary | ICD-10-CM | POA: Diagnosis not present

## 2022-10-06 ENCOUNTER — Emergency Department (HOSPITAL_COMMUNITY)
Admission: EM | Admit: 2022-10-06 | Discharge: 2022-10-06 | Disposition: A | Discharge status: Home / Self Care | Payer: BC Managed Care – PPO | Attending: Emergency Medicine | Admitting: Emergency Medicine

## 2022-10-06 ENCOUNTER — Other Ambulatory Visit: Payer: Self-pay

## 2022-10-06 ENCOUNTER — Emergency Department (HOSPITAL_COMMUNITY): Payer: BC Managed Care – PPO

## 2022-10-06 ENCOUNTER — Encounter (HOSPITAL_COMMUNITY): Payer: Self-pay | Admitting: *Deleted

## 2022-10-06 DIAGNOSIS — Z8616 Personal history of COVID-19: Secondary | ICD-10-CM | POA: Diagnosis not present

## 2022-10-06 DIAGNOSIS — R509 Fever, unspecified: Secondary | ICD-10-CM | POA: Insufficient documentation

## 2022-10-06 DIAGNOSIS — R791 Abnormal coagulation profile: Secondary | ICD-10-CM | POA: Insufficient documentation

## 2022-10-06 LAB — URINALYSIS, W/ REFLEX TO CULTURE (INFECTION SUSPECTED)
Bacteria, UA: NONE SEEN
Bilirubin Urine: NEGATIVE
Glucose, UA: NEGATIVE mg/dL
Hgb urine dipstick: NEGATIVE
Ketones, ur: NEGATIVE mg/dL
Leukocytes,Ua: NEGATIVE
Nitrite: NEGATIVE
Protein, ur: NEGATIVE mg/dL
Specific Gravity, Urine: 1.001 — ABNORMAL LOW (ref 1.005–1.030)
pH: 7 (ref 5.0–8.0)

## 2022-10-06 LAB — CBC WITH DIFFERENTIAL/PLATELET
Abs Immature Granulocytes: 0.01 10*3/uL (ref 0.00–0.07)
Basophils Absolute: 0 10*3/uL (ref 0.0–0.1)
Basophils Relative: 1 %
Eosinophils Absolute: 0.1 10*3/uL (ref 0.0–0.5)
Eosinophils Relative: 1 %
HCT: 44.8 % (ref 39.0–52.0)
Hemoglobin: 15 g/dL (ref 13.0–17.0)
Immature Granulocytes: 0 %
Lymphocytes Relative: 29 %
Lymphs Abs: 1.8 10*3/uL (ref 0.7–4.0)
MCH: 31.2 pg (ref 26.0–34.0)
MCHC: 33.5 g/dL (ref 30.0–36.0)
MCV: 93.1 fL (ref 80.0–100.0)
Monocytes Absolute: 0.5 10*3/uL (ref 0.1–1.0)
Monocytes Relative: 9 %
Neutro Abs: 3.9 10*3/uL (ref 1.7–7.7)
Neutrophils Relative %: 60 %
Platelets: 294 10*3/uL (ref 150–400)
RBC: 4.81 MIL/uL (ref 4.22–5.81)
RDW: 12.4 % (ref 11.5–15.5)
WBC: 6.4 10*3/uL (ref 4.0–10.5)
nRBC: 0 % (ref 0.0–0.2)

## 2022-10-06 LAB — LACTIC ACID, PLASMA: Lactic Acid, Venous: 1.3 mmol/L (ref 0.5–1.9)

## 2022-10-06 LAB — BASIC METABOLIC PANEL
Anion gap: 9 (ref 5–15)
BUN: 10 mg/dL (ref 6–20)
CO2: 29 mmol/L (ref 22–32)
Calcium: 9.5 mg/dL (ref 8.9–10.3)
Chloride: 100 mmol/L (ref 98–111)
Creatinine, Ser: 0.94 mg/dL (ref 0.61–1.24)
GFR, Estimated: >60 mL/min (ref >60)
Glucose, Bld: 101 mg/dL — ABNORMAL HIGH (ref 70–99)
Potassium: 4.3 mmol/L (ref 3.5–5.1)
Sodium: 138 mmol/L (ref 135–145)

## 2022-10-06 LAB — PROTIME-INR
INR: 0.9 (ref 0.8–1.2)
Prothrombin Time: 12 seconds (ref 11.4–15.2)

## 2022-10-06 LAB — APTT: aPTT: 40 seconds — ABNORMAL HIGH (ref 24–36)

## 2022-10-06 MED ORDER — DOXYCYCLINE HYCLATE 100 MG PO CAPS: 100.0000 mg | ORAL_CAPSULE | Freq: Two times a day (BID) | ORAL | 0 refills | Status: AC

## 2022-10-06 NOTE — Discharge Instructions (Signed)
Please take your medications as prescribed. Take tylenol/ibuprofen for fever/pain. I recommend close follow-up with PCP for reevaluation.  Please do not hesitate to return to emergency department if worrisome signs symptoms we discussed become apparent.  

## 2022-10-06 NOTE — ED Triage Notes (Signed)
Pt states he has been treated for pneumonia for the last 2 weeks; pt states he has been running fevers as high as 105  Pt has been taking doxycycline for the last couple of weeks  Pt was told my his PCP to come to ED to rule out sepsis  Pt c/o fatigue

## 2022-10-06 NOTE — ED Notes (Signed)
ED Provider at bedside. 

## 2022-10-06 NOTE — ED Provider Notes (Signed)
[place] EMERGENCY DEPARTMENT AT [facility] Provider Note   CSN: [id number] Arrival date & time: 10/06/22  9528     History  Chief Complaint  Patient presents with   Fever    Marc Mercado is a 52 y.o. male past medical history of ulcerative colitis on immunosuppressive therapy presents today for evaluation of fever.  Patient states he was diagnosed with pneumonia 2 weeks ago.  He was put on Doxy for 2 weeks.  He finished the antibiotics yesterday.  States he is still having a fever running from 100-105.  He does not have any other symptoms this time.  Denies chest pain, cough, shortness of breath, nausea, vomiting, bowel change, urinary symptoms.  States his PCP told him to come to the ER to have workup to rule out sepsis.   Fever   Past Medical History:  Diagnosis Date   Anal fistula    Anemia    Blood transfusion AGE 73   Chest pressure    COVID    feb 2020 felt bad all over, fever, fatigue,sore throat, nasl congestion. H/a, coughing for about 2 weeks.  lost taste and smell for 2 months.   COVID    Nov 202 fever, sinus congestion, fatigue for 2 days   Generalized headaches    GERD (gastroesophageal reflux disease)    Hearing deficit, bilateral    wears hearing aides in both ears. 02/25/2021   Hemorrhoids    Hernia, inguinal, right    History of stomach ulcers    Hypercholesterolemia    Migraine    vascular migraines doesnt take anything for them. occur a couple times a year. 02/25/2021   Perirectal abscess    Ulcerative colitis    Wears glasses    02/25/2021   Past Surgical History:  Procedure Laterality Date   EVALUATION UNDER ANESTHESIA WITH ANAL FISTULECTOMY N/A 12/31/2018   Procedure: ANAL EXAM UNDER ANESTHESIA WITH ANAL FISTULOTOMY;  Surgeon: Romie Levee, MD;  Location: [facility];  Service: General;  Laterality: N/A;   HEMORRHOID SURGERY  2010 and 2012   INCISION AND DRAINAGE ABSCESS N/A 04/16/2018   Procedure: INCISION  AND DRAINAGE OF PERIRECTAL ABSCESS;  Surgeon: Romie Levee, MD;  Location: [facility];  Service: General;  Laterality: N/A;   MOUTH SURGERY  2011   GLAND REMOVED FROM BOTTOM LIP ON RIGHT   RECTAL EXAM UNDER ANESTHESIA N/A 02/28/2021   Procedure: ANORECTAL EXAM UNDER ANESTHESIA;  Surgeon: Andria Meuse, MD;  Location: [facility];  Service: General;  Laterality: N/A;   SPHINCTETOTOMY     SPINE SURGERY  2006   L5   SURGERY FOR FOOT FRACTURE Left 2019   TRANSANAL EXCISION OF RECTAL MASS N/A 02/28/2021   Procedure: EXCISION OF ANAL POLYP;  Surgeon: Andria Meuse, MD;  Location: [facility];  Service: General;  Laterality: N/A;   WRIST SURGERY Left 2018   CYST REMOVED     Home Medications Prior to Admission medications   Medication Sig Start Date End Date Taking? Authorizing Provider  azaTHIOprine (IMURAN) 50 MG tablet Take 150 mg by mouth daily.    [provider]      Allergies    Celebrex [celecoxib], Nsaids, and Levaquin [levofloxacin]    Review of Systems   Review of Systems  Constitutional:  Positive for fever.    Physical Exam Updated Vital Signs BP (!) 135/103 (BP Location: Right Arm)   Pulse 89  Temp 98.9 F (37.2 C) (Oral)   Resp 20   Ht 6' (1.829 m)   Wt 74.8 kg   SpO2 100%   BMI 22.38 kg/m  Physical Exam Vitals and nursing note reviewed.  Constitutional:      Appearance: Normal appearance.  HENT:     Head: Normocephalic and atraumatic.     Mouth/Throat:     Mouth: Mucous membranes are moist.  Eyes:     General: No scleral icterus. Cardiovascular:     Rate and Rhythm: Normal rate and regular rhythm.     Pulses: Normal pulses.     Heart sounds: Normal heart sounds.  Pulmonary:     Effort: Pulmonary effort is normal.     Breath sounds: Normal breath sounds.  Abdominal:     General: Abdomen is flat.     Palpations: Abdomen is soft.     Tenderness: There is no abdominal tenderness.   Musculoskeletal:        General: No deformity.  Skin:    General: Skin is warm.     Findings: No rash.  Neurological:     General: No focal deficit present.     Mental Status: He is alert.  Psychiatric:        Mood and Affect: Mood normal.    ED Results / Procedures / Treatments   Labs (all labs ordered are listed, but only abnormal results are displayed) Labs Reviewed  BASIC METABOLIC PANEL - Abnormal; Notable for the following components:      Result Value   Glucose, Bld 101 (*)    All other components within normal limits  CBC WITH DIFFERENTIAL/PLATELET    EKG None  Radiology DG Chest 2 View  Result Date: 10/06/2022 CLINICAL DATA:  Fever. EXAM: CHEST - 2 VIEW COMPARISON:  September 19, 2022. FINDINGS: The heart size and mediastinal contours are within normal limits. Both lungs are clear. The visualized skeletal structures are unremarkable. IMPRESSION: No active cardiopulmonary disease. Electronically Signed   By: Lupita Raider M.D.   On: 10/06/2022 10:42    Procedures Procedures    Medications Ordered in ED Medications - No data to display  ED Course/ Medical Decision Making/ A&P                             Medical Decision Making Amount and/or Complexity of Data Reviewed Labs: ordered. Radiology: ordered.  Risk Prescription drug management.   This patient presents to the ED for fever, this involves an extensive number of treatment options, and is a complaint that carries with a high risk of complications and morbidity.  The differential diagnosis includes bronchitis, pneumonia, sepsis, infectious etiology.  This is not an exhaustive list.  Lab tests: I ordered and personally interpreted labs.  The pertinent results include: WBC unremarkable. Hbg unremarkable. Platelets unremarkable. Electrolytes unremarkable. BUN, creatinine unremarkable.  aPTT 40.  Imaging studies: I ordered imaging studies. I personally reviewed, interpreted imaging and agree with the  radiologist's interpretations. The results include: Chest x-ray showed no pneumonia.  Problem list/ ED course/ Critical interventions/ Medical management: HPI: See above Vital signs within normal range and stable throughout visit. Laboratory/imaging studies significant for: See above. On physical examination, patient is afebrile and appears in no acute distress.  Patient presents due to concern for sepsis.  Patient had pneumonia 2 weeks ago and just finished his doxycycline.  Patient continued to have fever from 101-105 reported by family at  home.  He was told by his PCP to come to the emergency department to rule out sepsis.  Chest x-ray showed no evidence of pneumonia.  CBC with no leukocytosis or anemia.  CMP with no acute electrolyte abnormality.  Lactic acid 1.3.  Blood cultures was drawn and pending.  Patient is hemodynamically stable at this point.  Advised patient to check MyChart in the next 1 to 2 days for blood culture results.  Advised patient to return if blood culture is positive.  Advised him to follow-up with his PCP for further evaluation and management.  Strict return precaution given. I have reviewed the patient home medicines and have made adjustments as needed.  Cardiac monitoring/EKG: The patient was maintained on a cardiac monitor.  I personally reviewed and interpreted the cardiac monitor which showed an underlying rhythm of: sinus rhythm.  Additional history obtained: External records from outside source obtained and reviewed including: Chart review including previous notes, labs, imaging.  Consultations obtained:  Disposition Continued outpatient therapy. Follow-up with PCP recommended for reevaluation of symptoms. Treatment plan discussed with patient.  Pt acknowledged understanding was agreeable to the plan. Worrisome signs and symptoms were discussed with patient, and patient acknowledged understanding to return to the ED if they noticed these signs and symptoms.  Patient was stable upon discharge.   This chart was dictated using voice recognition software.  Despite best efforts to proofread,  errors can occur which can change the documentation meaning.          Final Clinical Impression(s) / ED Diagnoses Final diagnoses:  Fever, unspecified fever cause    Rx / DC Orders ED Discharge Orders          Ordered    doxycycline (VIBRAMYCIN) 100 MG capsule  2 times daily        10/06/22 1515              Jeanelle Malling, [place] 10/07/22 2019    Gloris Manchester, MD 10/11/22 [phone]

## 2022-10-06 NOTE — ED Notes (Signed)
Pt verbalized understanding of discharge instructions. Opportunity for questions provided.  

## 2022-10-11 LAB — CULTURE, BLOOD (ROUTINE X 2)
Culture: NO GROWTH
Culture: NO GROWTH
Special Requests: ADEQUATE

## 2022-10-20 DIAGNOSIS — M25519 Pain in unspecified shoulder: Secondary | ICD-10-CM | POA: Diagnosis not present

## 2022-10-20 DIAGNOSIS — J189 Pneumonia, unspecified organism: Secondary | ICD-10-CM | POA: Diagnosis not present

## 2022-12-16 DIAGNOSIS — Z131 Encounter for screening for diabetes mellitus: Secondary | ICD-10-CM | POA: Diagnosis not present

## 2022-12-16 DIAGNOSIS — Z0000 Encounter for general adult medical examination without abnormal findings: Secondary | ICD-10-CM | POA: Diagnosis not present

## 2022-12-16 DIAGNOSIS — E785 Hyperlipidemia, unspecified: Secondary | ICD-10-CM | POA: Diagnosis not present

## 2022-12-16 DIAGNOSIS — Z79899 Other long term (current) drug therapy: Secondary | ICD-10-CM | POA: Diagnosis not present

## 2022-12-16 DIAGNOSIS — Z125 Encounter for screening for malignant neoplasm of prostate: Secondary | ICD-10-CM | POA: Diagnosis not present

## 2022-12-16 DIAGNOSIS — K51519 Left sided colitis with unspecified complications: Secondary | ICD-10-CM | POA: Diagnosis not present

## 2023-08-31 DIAGNOSIS — H903 Sensorineural hearing loss, bilateral: Secondary | ICD-10-CM | POA: Diagnosis not present

## 2023-12-16 DIAGNOSIS — Z0000 Encounter for general adult medical examination without abnormal findings: Secondary | ICD-10-CM | POA: Diagnosis not present

## 2023-12-16 DIAGNOSIS — E785 Hyperlipidemia, unspecified: Secondary | ICD-10-CM | POA: Diagnosis not present

## 2023-12-16 DIAGNOSIS — Z125 Encounter for screening for malignant neoplasm of prostate: Secondary | ICD-10-CM | POA: Diagnosis not present

## 2023-12-16 DIAGNOSIS — Z79899 Other long term (current) drug therapy: Secondary | ICD-10-CM | POA: Diagnosis not present

## 2023-12-16 DIAGNOSIS — K5150 Left sided colitis without complications: Secondary | ICD-10-CM | POA: Diagnosis not present

## 2023-12-16 DIAGNOSIS — R7301 Impaired fasting glucose: Secondary | ICD-10-CM | POA: Diagnosis not present

## 2024-02-05 DIAGNOSIS — L821 Other seborrheic keratosis: Secondary | ICD-10-CM | POA: Diagnosis not present

## 2024-02-05 DIAGNOSIS — L578 Other skin changes due to chronic exposure to nonionizing radiation: Secondary | ICD-10-CM | POA: Diagnosis not present

## 2024-02-05 DIAGNOSIS — D2271 Melanocytic nevi of right lower limb, including hip: Secondary | ICD-10-CM | POA: Diagnosis not present

## 2024-02-05 DIAGNOSIS — D485 Neoplasm of uncertain behavior of skin: Secondary | ICD-10-CM | POA: Diagnosis not present

## 2024-02-05 DIAGNOSIS — D2361 Other benign neoplasm of skin of right upper limb, including shoulder: Secondary | ICD-10-CM | POA: Diagnosis not present

## 2024-02-05 DIAGNOSIS — L570 Actinic keratosis: Secondary | ICD-10-CM | POA: Diagnosis not present

## 2024-02-05 DIAGNOSIS — L820 Inflamed seborrheic keratosis: Secondary | ICD-10-CM | POA: Diagnosis not present

## 2024-02-05 DIAGNOSIS — L814 Other melanin hyperpigmentation: Secondary | ICD-10-CM | POA: Diagnosis not present

## 2024-02-05 DIAGNOSIS — D1801 Hemangioma of skin and subcutaneous tissue: Secondary | ICD-10-CM | POA: Diagnosis not present

## 2024-03-29 DIAGNOSIS — M65841 Other synovitis and tenosynovitis, right hand: Secondary | ICD-10-CM | POA: Diagnosis not present

## 2024-03-29 DIAGNOSIS — M24444 Recurrent dislocation, right finger: Secondary | ICD-10-CM | POA: Diagnosis not present

## 2024-04-06 DIAGNOSIS — M25641 Stiffness of right hand, not elsewhere classified: Secondary | ICD-10-CM | POA: Diagnosis not present

## 2024-04-14 DIAGNOSIS — M25641 Stiffness of right hand, not elsewhere classified: Secondary | ICD-10-CM | POA: Diagnosis not present

## 2024-04-20 DIAGNOSIS — M25641 Stiffness of right hand, not elsewhere classified: Secondary | ICD-10-CM | POA: Diagnosis not present

## 2024-04-27 DIAGNOSIS — M25641 Stiffness of right hand, not elsewhere classified: Secondary | ICD-10-CM | POA: Diagnosis not present

## 2024-05-04 DIAGNOSIS — M25641 Stiffness of right hand, not elsewhere classified: Secondary | ICD-10-CM | POA: Diagnosis not present

## 2024-05-16 DIAGNOSIS — M25641 Stiffness of right hand, not elsewhere classified: Secondary | ICD-10-CM | POA: Diagnosis not present

## 2024-05-23 DIAGNOSIS — M25641 Stiffness of right hand, not elsewhere classified: Secondary | ICD-10-CM | POA: Diagnosis not present

## 2024-05-26 DIAGNOSIS — M25641 Stiffness of right hand, not elsewhere classified: Secondary | ICD-10-CM | POA: Diagnosis not present

## 2024-05-30 DIAGNOSIS — M25641 Stiffness of right hand, not elsewhere classified: Secondary | ICD-10-CM | POA: Diagnosis not present

## 2024-06-01 DIAGNOSIS — M25641 Stiffness of right hand, not elsewhere classified: Secondary | ICD-10-CM | POA: Diagnosis not present

## 2024-06-14 DIAGNOSIS — M25641 Stiffness of right hand, not elsewhere classified: Secondary | ICD-10-CM | POA: Diagnosis not present
